# Patient Record
Sex: Female | Born: 1985 | Race: Black or African American | Hispanic: No | Marital: Single | Smoking: Never smoker
Health system: Southern US, Community
[De-identification: ages and names within clinical notes are randomized; demographics above are authoritative.]

## PROBLEM LIST (undated history)

## (undated) DIAGNOSIS — F419 Anxiety disorder, unspecified: Secondary | ICD-10-CM

## (undated) DIAGNOSIS — M5430 Sciatica, unspecified side: Secondary | ICD-10-CM

## (undated) DIAGNOSIS — D649 Anemia, unspecified: Secondary | ICD-10-CM

## (undated) DIAGNOSIS — F32A Depression, unspecified: Secondary | ICD-10-CM

## (undated) DIAGNOSIS — G43909 Migraine, unspecified, not intractable, without status migrainosus: Secondary | ICD-10-CM

## (undated) DIAGNOSIS — F329 Major depressive disorder, single episode, unspecified: Secondary | ICD-10-CM

## (undated) HISTORY — PX: APPENDECTOMY: SHX54

## (undated) HISTORY — PX: TONSILLECTOMY: SUR1361

---

## 2006-09-17 ENCOUNTER — Emergency Department (HOSPITAL_COMMUNITY): Admission: EM | Admit: 2006-09-17 | Discharge: 2006-09-17 | Payer: Self-pay | Admitting: Emergency Medicine

## 2008-03-10 ENCOUNTER — Emergency Department (HOSPITAL_BASED_OUTPATIENT_CLINIC_OR_DEPARTMENT_OTHER): Admission: EM | Admit: 2008-03-10 | Discharge: 2008-03-10 | Payer: Self-pay | Admitting: Emergency Medicine

## 2008-05-28 ENCOUNTER — Emergency Department (HOSPITAL_BASED_OUTPATIENT_CLINIC_OR_DEPARTMENT_OTHER): Admission: EM | Admit: 2008-05-28 | Discharge: 2008-05-28 | Payer: Self-pay | Admitting: Emergency Medicine

## 2008-09-22 ENCOUNTER — Emergency Department (HOSPITAL_BASED_OUTPATIENT_CLINIC_OR_DEPARTMENT_OTHER): Admission: EM | Admit: 2008-09-22 | Discharge: 2008-09-22 | Payer: Self-pay | Admitting: Emergency Medicine

## 2008-09-27 ENCOUNTER — Emergency Department (HOSPITAL_BASED_OUTPATIENT_CLINIC_OR_DEPARTMENT_OTHER): Admission: EM | Admit: 2008-09-27 | Discharge: 2008-09-28 | Payer: Self-pay | Admitting: Emergency Medicine

## 2008-09-28 ENCOUNTER — Ambulatory Visit: Payer: Self-pay | Admitting: Radiology

## 2009-04-21 ENCOUNTER — Ambulatory Visit: Payer: Self-pay | Admitting: Diagnostic Radiology

## 2009-04-21 ENCOUNTER — Emergency Department (HOSPITAL_BASED_OUTPATIENT_CLINIC_OR_DEPARTMENT_OTHER): Admission: EM | Admit: 2009-04-21 | Discharge: 2009-04-21 | Payer: Self-pay | Admitting: Emergency Medicine

## 2010-04-15 LAB — BASIC METABOLIC PANEL
BUN: 15 mg/dL (ref 6–23)
CO2: 26 mEq/L (ref 19–32)
CO2: 28 mEq/L (ref 19–32)
Calcium: 9.3 mg/dL (ref 8.4–10.5)
Creatinine, Ser: 0.6 mg/dL (ref 0.4–1.2)
Creatinine, Ser: 0.6 mg/dL (ref 0.4–1.2)
GFR calc Af Amer: >60 mL/min (ref >60)
Glucose, Bld: 87 mg/dL (ref 70–99)
Potassium: 4.1 mEq/L (ref 3.5–5.1)
Sodium: 141 mEq/L (ref 135–145)

## 2010-04-15 LAB — URINALYSIS, ROUTINE W REFLEX MICROSCOPIC
Bilirubin Urine: NEGATIVE
Glucose, UA: NEGATIVE mg/dL
Leukocytes, UA: NEGATIVE
Protein, ur: NEGATIVE mg/dL
Specific Gravity, Urine: 1.022 (ref 1.005–1.030)
Urobilinogen, UA: >8 mg/dL — ABNORMAL HIGH (ref 0.0–1.0)

## 2010-04-15 LAB — DIFFERENTIAL
Basophils Absolute: 0.2 10*3/uL — ABNORMAL HIGH (ref 0.0–0.1)
Neutrophils Relative %: 50 % (ref 43–77)

## 2010-04-15 LAB — PREGNANCY, URINE
Preg Test, Ur: NEGATIVE
Preg Test, Ur: NEGATIVE

## 2010-04-15 LAB — URINE MICROSCOPIC-ADD ON

## 2010-04-15 LAB — CBC
RDW: 11.9 % (ref 11.5–15.5)
WBC: 8.5 10*3/uL (ref 4.0–10.5)

## 2010-08-14 ENCOUNTER — Emergency Department (HOSPITAL_BASED_OUTPATIENT_CLINIC_OR_DEPARTMENT_OTHER)
Admission: EM | Admit: 2010-08-14 | Discharge: 2010-08-14 | Disposition: A | Discharge status: Home / Self Care | Payer: BC Managed Care – PPO | Attending: Emergency Medicine | Admitting: Emergency Medicine

## 2010-08-14 ENCOUNTER — Encounter: Payer: Self-pay | Admitting: *Deleted

## 2010-08-14 DIAGNOSIS — M25559 Pain in unspecified hip: Secondary | ICD-10-CM | POA: Insufficient documentation

## 2010-08-14 DIAGNOSIS — M76899 Other specified enthesopathies of unspecified lower limb, excluding foot: Secondary | ICD-10-CM | POA: Insufficient documentation

## 2010-08-14 DIAGNOSIS — M7071 Other bursitis of hip, right hip: Secondary | ICD-10-CM

## 2010-08-14 HISTORY — DX: Migraine, unspecified, not intractable, without status migrainosus: G43.909

## 2010-08-14 MED ORDER — HYDROCODONE-ACETAMINOPHEN 5-500 MG PO TABS: 1.0000 | ORAL_TABLET | Freq: Four times a day (QID) | ORAL | Status: AC | PRN

## 2010-08-14 NOTE — ED Notes (Signed)
Pt reports Hx of  R hip pain and decreased mobility x 3 day  Ambulatory with limp

## 2010-08-14 NOTE — ED Provider Notes (Signed)
History     CSN: [id number] Arrival date & time: 08/14/2010  7:36 PM  Chief Complaint  Patient presents with  . 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 female presenting with hip pain. The history is provided by the patient. No language interpreter was used.  Hip Pain This is a recurrent problem. The current episode started in the past 7 days. The problem occurs constantly. The problem has been unchanged. Pertinent negatives include no abdominal pain, fever, joint swelling or rash. The symptoms are aggravated by walking and standing. She has tried relaxation (steriods) for the symptoms. The treatment provided mild relief.    Past Medical History  Diagnosis Date  . Migraines     Past Surgical History  Procedure Date  . Tosillectomy     No family history on file.  History  Substance Use Topics  . Smoking status: Never Smoker   . Smokeless tobacco: Not on file  . Alcohol Use: No    OB History    Grav Para Term Preterm Abortions TAB SAB Ect Mult Living                  Review of Systems  Constitutional: Negative for fever.  Gastrointestinal: Negative for abdominal pain.  Musculoskeletal: Negative for joint swelling.  Skin: Negative for rash.  All other systems reviewed and are negative.    Physical Exam  BP 105/68  Pulse 100  Temp(Src) 98.4 F (36.9 C) (Oral)  Resp 22  SpO2 99%  LMP 07/14/2010  Physical Exam  Nursing note and vitals reviewed. Constitutional: She is oriented to person, place, and time. She appears well-developed and well-nourished.  Cardiovascular: Normal rate and regular rhythm.   Pulmonary/Chest: Effort normal and breath sounds normal.  Abdominal: Soft. Bowel sounds are normal.  Musculoskeletal: Normal range of  motion. She exhibits tenderness.       Pt has generalized tenderness with palpation to the right hip:no obvious deformity noted to the area  Neurological: She is oriented to person, place, and time.  Skin: Skin is warm and dry.  Psychiatric: She has a normal mood and affect.    ED Course  Procedures  MDM Pt has not had injury:likely her bursitis:will treat symptomatically and have follow up with ortho      Teressa Lower, NP 08/14/10 2101

## 2010-08-15 NOTE — ED Provider Notes (Signed)
Medical screening examination/treatment/procedure(s) were performed by non-physician practitioner and as supervising physician I was immediately available for consultation/collaboration.  Carsen Leaf R. Kamisha Ell, MD 08/15/10 0003 

## 2011-06-18 ENCOUNTER — Encounter (HOSPITAL_BASED_OUTPATIENT_CLINIC_OR_DEPARTMENT_OTHER): Payer: Self-pay | Admitting: *Deleted

## 2011-06-18 ENCOUNTER — Emergency Department (HOSPITAL_BASED_OUTPATIENT_CLINIC_OR_DEPARTMENT_OTHER): Payer: BC Managed Care – PPO

## 2011-06-18 ENCOUNTER — Emergency Department (HOSPITAL_BASED_OUTPATIENT_CLINIC_OR_DEPARTMENT_OTHER)
Admission: EM | Admit: 2011-06-18 | Discharge: 2011-06-19 | Disposition: A | Discharge status: Home / Self Care | Payer: BC Managed Care – PPO | Attending: Emergency Medicine | Admitting: Emergency Medicine

## 2011-06-18 DIAGNOSIS — R51 Headache: Secondary | ICD-10-CM | POA: Insufficient documentation

## 2011-06-18 DIAGNOSIS — I10 Essential (primary) hypertension: Secondary | ICD-10-CM | POA: Insufficient documentation

## 2011-06-18 LAB — CBC
Hemoglobin: 8.9 g/dL — ABNORMAL LOW (ref 12.0–15.0)
MCV: 82.8 fL (ref 78.0–100.0)
Platelets: 401 10*3/uL — ABNORMAL HIGH (ref 150–400)
RDW: 13.9 % (ref 11.5–15.5)

## 2011-06-18 LAB — DIFFERENTIAL: Lymphocytes Relative: 19 % (ref 12–46)

## 2011-06-18 MED ORDER — KETOROLAC TROMETHAMINE 60 MG/2ML IM SOLN
60.0000 mg | Freq: Once | INTRAMUSCULAR | Status: AC
  Administered 2011-06-18: 60 mg via INTRAMUSCULAR
  Filled 2011-06-18: qty 2

## 2011-06-18 MED ORDER — PROMETHAZINE HCL 25 MG/ML IJ SOLN
25.0000 mg | Freq: Once | INTRAMUSCULAR | Status: AC
  Administered 2011-06-18: 25 mg via INTRAMUSCULAR
  Filled 2011-06-18: qty 1

## 2011-06-18 NOTE — ED Provider Notes (Signed)
History   This chart was scribed for Geoffery Lyons, MD by Melba Coon. The patient was seen in room MH07/MH07 and the patient's care was started at 10:28PM.    CSN: [id number]  Arrival date & time 06/18/11  2050   First MD Initiated Contact with Patient 06/18/11 2229      Chief Complaint  Patient presents with  . Headache    (Consider location/radiation/quality/duration/timing/severity/associated sxs/prior treatment) HPI Allante Beane is a 26 y.o. female who presents to the Emergency Department complaining of constant, moderate to severe headache more of the left side with associated HTN with an onset 2 days ago. Pt recently just had a successful C-section but pt hasn't felt right since the pregnancy. Nurse came out to pt residence and took pt BP which was 170/100. Pt then went to [facility] yesterday where they did blood w/u and UA; the results came back normal. No prior hx of HTN. No chronic Hx of HAs. Pain meds have not been taken. No fever, neck pain, sore throat, rash, back pain, CP, SOB, abd pain, n/v/d, dysuria, or extremity pain, edema, weakness, numbness, or tingling. No known allergies. No other pertinent medical symptoms.   Past Medical History  Diagnosis Date  . Migraines     Past Surgical History  Procedure Date  . Tosillectomy     History reviewed. No pertinent family history.  History  Substance Use Topics  . Smoking status: Never Smoker   . Smokeless tobacco: Not on file  . Alcohol Use: No    OB History    Grav Para Term Preterm Abortions TAB SAB Ect Mult Living   1 1              Review of Systems 10 Systems reviewed and all are negative for acute change except as noted in the HPI.   Allergies  Review of patient's allergies indicates no known allergies.  Home Medications   Current Outpatient Rx  Name Route Sig Dispense Refill  . ACETAMINOPHEN-CODEINE #3 300-30 MG PO TABS Oral Take 1 tablet by mouth every 4 (four) hours as needed.     Marland Kitchen BUTALBITAL-APAP-CAFF-COD 50-325-40-30 MG PO CAPS Oral Take 1 capsule by mouth every 4 (four) hours as needed.    . CEPHALEXIN 500 MG PO CAPS Oral Take 500 mg by mouth 4 (four) times daily.    Marland Kitchen COLACE PO Oral Take 2 capsules by mouth daily as needed. Patient is using this medication for constipation.    Marland Kitchen FERROUS GLUCONATE 325 MG PO TABS Oral Take 325 mg by mouth daily with breakfast.    . FOLIC ACID 1 MG PO TABS Oral Take 1 mg by mouth daily.    Marland Kitchen HYDROCODONE-ACETAMINOPHEN 7.5-325 MG PO TABS Oral Take 1 tablet by mouth every 6 (six) hours as needed.    . IBUPROFEN 800 MG PO TABS Oral Take 800 mg by mouth every 8 (eight) hours as needed.    . METHYLDOPA 500 MG PO TABS Oral Take 500 mg by mouth 3 (three) times daily.    Marland Kitchen POLYETHYLENE GLYCOL 3350 PO PACK Oral Take 17 g by mouth daily as needed. constipation       BP 159/101  Pulse 96  Temp(Src) 99.8 F (37.7 C) (Oral)  Resp 20  Ht 5\' 5"  (1.651 m)  Wt 222 lb (100.699 kg)  BMI 36.94 kg/m2  SpO2 98%  Physical Exam  Nursing note and vitals reviewed. Constitutional: She is oriented to person, place, and time. She  appears well-developed and well-nourished.       Awake, alert, nontoxic appearance.  HENT:  Head: Normocephalic and atraumatic.  Right Ear: External ear normal.  Left Ear: External ear normal.       TMs nml bilaterally  Eyes: EOM are normal. Pupils are equal, round, and reactive to light. Right eye exhibits no discharge. Left eye exhibits no discharge.  Neck: Normal range of motion.  Cardiovascular: Normal rate, regular rhythm and normal heart sounds.   No murmur heard. Pulmonary/Chest: Effort normal and breath sounds normal. She exhibits no tenderness.  Abdominal: Soft. Bowel sounds are normal. There is no tenderness. There is no rebound.  Musculoskeletal: She exhibits no tenderness.       Baseline ROM, no obvious new focal weakness.  Neurological: She is alert and oriented to person, place, and time.       Mental  status and motor strength appears baseline for patient and situation.  Skin: Skin is warm. No rash noted.  Psychiatric: She has a normal mood and affect. Her behavior is normal.    ED Course  Procedures (including critical care time)  DIAGNOSTIC STUDIES: Oxygen Saturation is 98% on room air, normal by my interpretation.    COORDINATION OF CARE:  10:35PM - EDMD will order CT head, pain meds, and blood w/u for the pt.   Labs Reviewed - No data to display No results found.   No diagnosis found.    MDM  The ct of the head and labs all look okay.  It does not appear as though there is an emergent cause of her headache.  I suspect her blood pressure is elevated because of the headache, rather than the cause.  She will be discharged to home.     I personally performed the services described in this documentation, which was scribed in my presence. The recorded information has been reviewed and considered.          Geoffery Lyons, MD 06/19/11 0005

## 2011-06-18 NOTE — ED Notes (Signed)
Pt states she noticed her BP was up on Friday (171/105, 169/111) 1 week post C-section. Seen at [facility] yesterday. Labs and cath U/A done. Started on BP med, but does not feel any better.

## 2011-06-19 LAB — COMPREHENSIVE METABOLIC PANEL
ALT: 10 U/L (ref 0–35)
AST: 14 U/L (ref 0–37)
Alkaline Phosphatase: 93 U/L (ref 39–117)
BUN: 11 mg/dL (ref 6–23)
CO2: 24 mEq/L (ref 19–32)
Chloride: 105 mEq/L (ref 96–112)
Creatinine, Ser: 0.6 mg/dL (ref 0.50–1.10)
GFR calc non Af Amer: >90 mL/min (ref >90)
Glucose, Bld: 95 mg/dL (ref 70–99)
Sodium: 139 mEq/L (ref 135–145)

## 2011-06-19 NOTE — ED Notes (Signed)
D/c home with family- states will f/u with doctor tomorrow as planned

## 2011-06-19 NOTE — Discharge Instructions (Signed)
General Headache, Without Cause A general headache has no specific cause. These headaches are not life-threatening. They will not lead to other types of headaches. HOME CARE   Make and keep follow-up visits with your doctor.   Only take medicine as told by your doctor.   Try to relax, get a massage, or use your thoughts to control your body (biofeedback).   Apply cold or heat to the head and neck. Apply 3 or 4 times a day or as needed.  Finding out the results of your test Ask when your test results will be ready. Make sure you get your test results. GET HELP RIGHT AWAY IF:   You have problems with medicine.   Your medicine does not help relieve pain.   Your headache changes or becomes worse.   You feel sick to your stomach (nauseous) or throw up (vomit).   You have a temperature by mouth above 102 F (38.9 C), not controlled by medicine.   Your have a stiff neck.   You have vision loss.   You have muscle weakness.   You lose control of your muscles.   You lose balance or have trouble walking.   You feel like you are going to pass out (faint).  MAKE SURE YOU:   Understand these instructions.   Will watch this condition.   Will get help right away if you are not doing well or get worse.  Document Released: 10/05/2007 Document Revised: 12/15/2010 Document Reviewed: 10/05/2007 [employer] Patient Information 2012 [employer]. 

## 2012-03-31 ENCOUNTER — Emergency Department (HOSPITAL_BASED_OUTPATIENT_CLINIC_OR_DEPARTMENT_OTHER)
Admission: EM | Admit: 2012-03-31 | Discharge: 2012-03-31 | Disposition: A | Discharge status: Home / Self Care | Payer: BC Managed Care – PPO | Attending: Emergency Medicine | Admitting: Emergency Medicine

## 2012-03-31 ENCOUNTER — Encounter (HOSPITAL_BASED_OUTPATIENT_CLINIC_OR_DEPARTMENT_OTHER): Payer: Self-pay

## 2012-03-31 ENCOUNTER — Emergency Department (HOSPITAL_BASED_OUTPATIENT_CLINIC_OR_DEPARTMENT_OTHER): Payer: BC Managed Care – PPO

## 2012-03-31 DIAGNOSIS — R072 Precordial pain: Secondary | ICD-10-CM | POA: Insufficient documentation

## 2012-03-31 DIAGNOSIS — R0602 Shortness of breath: Secondary | ICD-10-CM | POA: Insufficient documentation

## 2012-03-31 DIAGNOSIS — Z79899 Other long term (current) drug therapy: Secondary | ICD-10-CM | POA: Insufficient documentation

## 2012-03-31 DIAGNOSIS — M940 Chondrocostal junction syndrome [Tietze]: Secondary | ICD-10-CM | POA: Insufficient documentation

## 2012-03-31 DIAGNOSIS — Z8679 Personal history of other diseases of the circulatory system: Secondary | ICD-10-CM | POA: Insufficient documentation

## 2012-03-31 MED ORDER — IBUPROFEN 800 MG PO TABS: 800.0000 mg | ORAL_TABLET | Freq: Once | ORAL | Status: AC

## 2012-03-31 MED ORDER — IBUPROFEN 800 MG PO TABS
ORAL_TABLET | ORAL | Status: AC
  Administered 2012-03-31: 800 mg via ORAL
  Filled 2012-03-31: qty 1

## 2012-03-31 MED ORDER — NAPROXEN 500 MG PO TABS: 500.0000 mg | ORAL_TABLET | Freq: Two times a day (BID) | ORAL | Status: DC

## 2012-03-31 MED ORDER — HYDROCOD POLST-CHLORPHEN POLST 10-8 MG/5ML PO LQCR: 5.0000 mL | Freq: Two times a day (BID) | ORAL | Status: DC | PRN

## 2012-03-31 MED ORDER — ALBUTEROL SULFATE HFA 108 (90 BASE) MCG/ACT IN AERS: 1.0000 | INHALATION_SPRAY | Freq: Four times a day (QID) | RESPIRATORY_TRACT | Status: DC | PRN

## 2012-03-31 NOTE — ED Provider Notes (Signed)
History     CSN: [id number]  Arrival date & time 03/31/12  1731   First MD Initiated Contact with Patient 03/31/12 1755      Chief Complaint  Patient presents with  . Cough  . Pleurisy    (Consider location/radiation/quality/duration/timing/severity/associated sxs/prior treatment) HPI Comments: Pt presents to the ED for non-productive cough x 2 weeks.  Now she is having some non-radiating mid-sternal chest pain that is exacerbated by twisting movements or deep breathing.  Intermittent SOB with exertion. Recent episode of laryngitis which has finally resolved.  Has not tried any OTC medications for her sx. Denies any palpitations, fever, nausea, vomiting, or abdominal pain.  No cardiac hx.  Patient is a 27 y.o. female presenting with cough. The history is provided by the patient.  Cough   Past Medical History  Diagnosis Date  . Migraines     Past Surgical History  Procedure Laterality Date  . Tosillectomy    . Cesarean section  06/09/2011    History reviewed. No pertinent family history.  History  Substance Use Topics  . Smoking status: Never Smoker   . Smokeless tobacco: Never Used  . Alcohol Use: No    OB History   Grav Para Term Preterm Abortions TAB SAB Ect Mult Living   1 1              Review of Systems  Respiratory: Positive for cough.   All other systems reviewed and are negative.    Allergies  Review of patient's allergies indicates no known allergies.  Home Medications   Current Outpatient Rx  Name  Route  Sig  Dispense  Refill  . etonogestrel (NEXPLANON) 68 MG IMPL implant   Subcutaneous   Inject 1 each into the skin once.         . phentermine 37.5 MG capsule   Oral   Take 37.5 mg by mouth every morning.         . topiramate (TOPAMAX) 100 MG tablet   Oral   Take 100 mg by mouth daily.         Marland Kitchen acetaminophen-codeine (TYLENOL #3) 300-30 MG per tablet   Oral   Take 1 tablet by mouth every 4 (four) hours as needed.         .  butalbital-acetaminophen-caffeine (FIORICET WITH CODEINE) 50-325-40-30 MG per capsule   Oral   Take 1 capsule by mouth every 4 (four) hours as needed.         . cephALEXin (KEFLEX) 500 MG capsule   Oral   Take 500 mg by mouth 4 (four) times daily.         Tery Sanfilippo Sodium (COLACE PO)   Oral   Take 2 capsules by mouth daily as needed. Patient is using this medication for constipation.         . ferrous gluconate (FERGON) 325 MG tablet   Oral   Take 325 mg by mouth daily with breakfast.         . folic acid (FOLVITE) 1 MG tablet   Oral   Take 1 mg by mouth daily.         Marland Kitchen HYDROcodone-acetaminophen (NORCO) 7.5-325 MG per tablet   Oral   Take 1 tablet by mouth every 6 (six) hours as needed.         Marland Kitchen ibuprofen (ADVIL,MOTRIN) 800 MG tablet   Oral   Take 800 mg by mouth every 8 (eight) hours as needed.         Marland Kitchen  methyldopa (ALDOMET) 500 MG tablet   Oral   Take 500 mg by mouth 3 (three) times daily.         . polyethylene glycol (MIRALAX / GLYCOLAX) packet   Oral   Take 17 g by mouth daily as needed. constipation            BP 127/90  Pulse 93  Temp(Src) 98.2 F (36.8 C) (Oral)  Resp 16  Ht 5\' 4"  (1.626 m)  Wt 198 lb (89.812 kg)  BMI 33.97 kg/m2  SpO2 100%  LMP 03/27/2012  Physical Exam  Nursing note and vitals reviewed. Constitutional: She is oriented to person, place, and time. She appears well-developed and well-nourished.  HENT:  Head: Normocephalic and atraumatic.  Right Ear: Tympanic membrane and ear canal normal.  Left Ear: Tympanic membrane and ear canal normal.  Nose: Nose normal.  Mouth/Throat: Uvula is midline, oropharynx is clear and moist and mucous membranes are normal. No oropharyngeal exudate, posterior oropharyngeal edema, posterior oropharyngeal erythema or tonsillar abscesses.  Eyes: Conjunctivae and EOM are normal. Pupils are equal, round, and reactive to light.  Neck: Normal range of motion.  Cardiovascular: Normal rate,  regular rhythm and normal heart sounds.   Pulmonary/Chest: Effort normal and breath sounds normal. She has no wheezes.  Chest pain is reproducible with palpation to L anterior chest wall  Abdominal: Soft. Bowel sounds are normal. There is no tenderness.  Lymphadenopathy:    She has no cervical adenopathy.  Neurological: She is alert and oriented to person, place, and time.  Skin: Skin is warm and dry.  Psychiatric: She has a normal mood and affect.    ED Course  Procedures (including critical care time)  Labs Reviewed - No data to display Dg Chest 2 View  03/31/2012  *RADIOLOGY REPORT*  Clinical Data: Chest pain.  CHEST - 2 VIEW  Comparison: 04/21/2009.  Findings: The cardiac silhouette, mediastinal and hilar contours are within normal limits and stable.  The lungs are clear.  No pleural effusion.  The bony thorax is intact.  IMPRESSION: No acute cardiopulmonary findings.   Original Report Authenticated By: Rudie Meyer, M.D.      1. Costochondritis, acute       MDM   CXR negative for bronchitis or pneumonia.  O2 sats 100% on room air.  Given nature of pain and reproducibility with palpation, suspect that pain is MSK in nature, likely costochondritis, from heavy bouts of coughing.  Rx anti-inflammatories, albuterol, and cough supressant.  Return precautions advised.       Garlon Hatchet, PA-C 03/31/12 2139

## 2012-03-31 NOTE — ED Notes (Signed)
Pt states that she has been coughing x2 weeks now, states that she has sternal chest pain when moving or coughing.  Cough is non productive and pt denies all other symptoms.

## 2012-04-01 NOTE — ED Provider Notes (Signed)
Medical screening examination/treatment/procedure(s) were performed by non-physician practitioner and as supervising physician I was immediately available for consultation/collaboration.   Nelia Shi, MD 04/01/12 [phone]

## 2012-06-24 ENCOUNTER — Other Ambulatory Visit: Payer: Self-pay | Admitting: Physician Assistant

## 2012-06-24 DIAGNOSIS — M25551 Pain in right hip: Secondary | ICD-10-CM

## 2012-06-30 ENCOUNTER — Ambulatory Visit
Admission: RE | Admit: 2012-06-30 | Discharge: 2012-06-30 | Disposition: A | Discharge status: Home / Self Care | Payer: BC Managed Care – PPO | Source: Ambulatory Visit | Attending: Physician Assistant | Admitting: Physician Assistant

## 2012-06-30 DIAGNOSIS — M25551 Pain in right hip: Secondary | ICD-10-CM

## 2012-06-30 MED ORDER — GADOBENATE DIMEGLUMINE 529 MG/ML IV SOLN
20.0000 mL | Freq: Once | INTRAVENOUS | Status: AC | PRN
  Administered 2012-06-30: 20 mL via INTRAVENOUS

## 2012-11-09 ENCOUNTER — Encounter (HOSPITAL_BASED_OUTPATIENT_CLINIC_OR_DEPARTMENT_OTHER): Payer: Self-pay | Admitting: Emergency Medicine

## 2012-11-09 ENCOUNTER — Emergency Department (HOSPITAL_BASED_OUTPATIENT_CLINIC_OR_DEPARTMENT_OTHER)
Admission: EM | Admit: 2012-11-09 | Discharge: 2012-11-09 | Disposition: A | Discharge status: Home / Self Care | Payer: BC Managed Care – PPO | Attending: Emergency Medicine | Admitting: Emergency Medicine

## 2012-11-09 DIAGNOSIS — IMO0001 Reserved for inherently not codable concepts without codable children: Secondary | ICD-10-CM | POA: Insufficient documentation

## 2012-11-09 DIAGNOSIS — M25559 Pain in unspecified hip: Secondary | ICD-10-CM | POA: Insufficient documentation

## 2012-11-09 DIAGNOSIS — Z79899 Other long term (current) drug therapy: Secondary | ICD-10-CM | POA: Insufficient documentation

## 2012-11-09 DIAGNOSIS — M25551 Pain in right hip: Secondary | ICD-10-CM

## 2012-11-09 DIAGNOSIS — G43909 Migraine, unspecified, not intractable, without status migrainosus: Secondary | ICD-10-CM | POA: Insufficient documentation

## 2012-11-09 DIAGNOSIS — M25459 Effusion, unspecified hip: Secondary | ICD-10-CM | POA: Insufficient documentation

## 2012-11-09 DIAGNOSIS — Z791 Long term (current) use of non-steroidal anti-inflammatories (NSAID): Secondary | ICD-10-CM | POA: Insufficient documentation

## 2012-11-09 DIAGNOSIS — D649 Anemia, unspecified: Secondary | ICD-10-CM | POA: Insufficient documentation

## 2012-11-09 HISTORY — DX: Sciatica, unspecified side: M54.30

## 2012-11-09 HISTORY — DX: Anemia, unspecified: D64.9

## 2012-11-09 MED ORDER — HYDROCODONE-ACETAMINOPHEN 5-325 MG PO TABS: 2.0000 | ORAL_TABLET | ORAL | Status: DC | PRN

## 2012-11-09 MED ORDER — HYDROCODONE-ACETAMINOPHEN 5-325 MG PO TABS
2.0000 | ORAL_TABLET | Freq: Once | ORAL | Status: AC
  Administered 2012-11-09: 2 via ORAL
  Filled 2012-11-09: qty 2

## 2012-11-09 MED ORDER — PREDNISONE 10 MG PO TABS: ORAL_TABLET | ORAL | Status: DC

## 2012-11-09 NOTE — ED Provider Notes (Signed)
CSN: [id number]     Arrival date & time 11/09/12  2205 History   First MD Initiated Contact with Patient 11/09/12 2216     Chief Complaint  Patient presents with  . Hip Pain   (Consider location/radiation/quality/duration/timing/severity/associated sxs/prior Treatment) Patient is a 27 y.o. female presenting with hip pain.  Hip Pain This is a new problem. Episode onset: 2 years. The problem occurs constantly. The problem has been gradually worsening. Associated symptoms include joint swelling and myalgias. Nothing aggravates the symptoms. She has tried nothing for the symptoms. The treatment provided moderate relief.   Pt complains of pain in both hips,  Pt has had a negative mri.   Pt reports orthopaedist toldhe she has bursitis. Past Medical History  Diagnosis Date  . Migraines   . Sciatica   . Anemia    Past Surgical History  Procedure Laterality Date  . Tosillectomy    . Cesarean section  06/09/2011   History reviewed. No pertinent family history. History  Substance Use Topics  . Smoking status: Never Smoker   . Smokeless tobacco: Never Used  . Alcohol Use: No   OB History   Grav Para Term Preterm Abortions TAB SAB Ect Mult Living   1 1             Review of Systems  Musculoskeletal: Positive for joint swelling and myalgias.  All other systems reviewed and are negative.    Allergies  Review of patient's allergies indicates no known allergies.  Home Medications   Current Outpatient Rx  Name  Route  Sig  Dispense  Refill  . Docusate Sodium (COLACE PO)   Oral   Take 2 capsules by mouth daily as needed. Patient is using this medication for constipation.         Marland Kitchen etonogestrel (NEXPLANON) 68 MG IMPL implant   Subcutaneous   Inject 1 each into the skin once.         Marland Kitchen ibuprofen (ADVIL,MOTRIN) 800 MG tablet   Oral   Take 800 mg by mouth every 8 (eight) hours as needed.         . polyethylene glycol (MIRALAX / GLYCOLAX) packet   Oral   Take 17 g by mouth  daily as needed. constipation          . potassium chloride (KLOR-CON) 20 MEQ packet   Oral   Take 20 mEq by mouth daily.         . pregabalin (LYRICA) 75 MG capsule   Oral   Take 75 mg by mouth 2 (two) times daily.         Marland Kitchen topiramate (TOPAMAX) 100 MG tablet   Oral   Take 100 mg by mouth daily.         Marland Kitchen acetaminophen-codeine (TYLENOL #3) 300-30 MG per tablet   Oral   Take 1 tablet by mouth every 4 (four) hours as needed.         Marland Kitchen albuterol (PROVENTIL HFA;VENTOLIN HFA) 108 (90 BASE) MCG/ACT inhaler   Inhalation   Inhale 1-2 puffs into the lungs every 6 (six) hours as needed for wheezing.   1 Inhaler   0   . butalbital-acetaminophen-caffeine (FIORICET WITH CODEINE) 50-325-40-30 MG per capsule   Oral   Take 1 capsule by mouth every 4 (four) hours as needed.         . cephALEXin (KEFLEX) 500 MG capsule   Oral   Take 500 mg by mouth 4 (four) times daily.         Marland Kitchen  chlorpheniramine-HYDROcodone (TUSSIONEX PENNKINETIC ER) 10-8 MG/5ML LQCR   Oral   Take 5 mLs by mouth every 12 (twelve) hours as needed (Cough).   115 mL   0   . ferrous gluconate (FERGON) 325 MG tablet   Oral   Take 325 mg by mouth daily with breakfast.         . folic acid (FOLVITE) 1 MG tablet   Oral   Take 1 mg by mouth daily.         Marland Kitchen HYDROcodone-acetaminophen (NORCO) 7.5-325 MG per tablet   Oral   Take 1 tablet by mouth every 6 (six) hours as needed.         . methyldopa (ALDOMET) 500 MG tablet   Oral   Take 500 mg by mouth 3 (three) times daily.         . naproxen (NAPROSYN) 500 MG tablet   Oral   Take 1 tablet (500 mg total) by mouth 2 (two) times daily with a meal.   30 tablet   0   . phentermine 37.5 MG capsule   Oral   Take 37.5 mg by mouth every morning.          There were no vitals taken for this visit. Physical Exam  Nursing note and vitals reviewed. Constitutional: She appears well-developed and well-nourished.  Cardiovascular: Normal rate.    Pulmonary/Chest: Effort normal.  Abdominal: Soft.  Musculoskeletal: She exhibits tenderness.  Tender bilat hips  Neurological: She is alert.  Skin: Skin is warm.  Psychiatric: She has a normal mood and affect.    ED Course  Procedures (including critical care time) Labs Review Labs Reviewed - No data to display Imaging Review No results found.  EKG Interpretation   None       MDM   1. Hip pain, bilateral        Elson Areas, PA-C 11/09/12 2255

## 2012-11-09 NOTE — ED Notes (Signed)
Pt reports bilateral hip pain x2 years that has worsened today, denies injury.

## 2012-11-09 NOTE — ED Notes (Signed)
Patient has chronic bilateral hip pain. Periodically the pain becomes worse and she is unable to treat the pain at home.

## 2012-11-10 NOTE — ED Provider Notes (Signed)
Medical screening examination/treatment/procedure(s) were performed by non-physician practitioner and as supervising physician I was immediately available for consultation/collaboration.  EKG Interpretation   None         Anani Gu S Aishah Teffeteller, MD 11/10/12 1033 

## 2012-11-15 ENCOUNTER — Ambulatory Visit (INDEPENDENT_AMBULATORY_CARE_PROVIDER_SITE_OTHER): Payer: Self-pay

## 2012-11-15 ENCOUNTER — Ambulatory Visit (INDEPENDENT_AMBULATORY_CARE_PROVIDER_SITE_OTHER): Payer: BC Managed Care – PPO | Admitting: Neurology

## 2012-11-15 DIAGNOSIS — M79609 Pain in unspecified limb: Secondary | ICD-10-CM

## 2012-11-15 DIAGNOSIS — Z0289 Encounter for other administrative examinations: Secondary | ICD-10-CM

## 2012-11-15 DIAGNOSIS — M79604 Pain in right leg: Secondary | ICD-10-CM

## 2012-11-15 NOTE — Procedures (Signed)
    [facility]  NCS (NERVE CONDUCTION STUDY) WITH EMG (ELECTROMYOGRAPHY) REPORT   STUDY DATE: 11/15/2012 PATIENT NAME: Ngina Royer DOB: May 12, 1985 MRN: [id number]    TECHNOLOGIST: Gearldine Shown ELECTROMYOGRAPHER: Levert Feinstein M.D.  CLINICAL INFORMATION:   27 years old female, with right hip pain,  FINDINGS: NERVE CONDUCTION STUDY: Bilateral peroneal sensory responses are normal. Bilateral peroneal to  EDB, and tibial motor responses were normal. Bilateral tibial H. reflexes were normal and symmetric    NEEDLE ELECTROMYOGRAPHY: Selected needle examination was performed at right lower extremity muscles and the right lumbosacral paraspinal muscles.  Needle examination of right tibialis anterior, tibialis posterior, medial gastrocnemius, vastus lateralis, biceps femoris long head was normal.  There was no spontaneous activity at right lumbosacral paraspinal muscles, right L4-L5 , S1,     IMPRESSION:   This is a normal study. There is no electrodiagnostic evidence of large fiber peripheral neuropathy or right lumbosacral radiculopathy    INTERPRETING PHYSICIAN:   Levert Feinstein M.D. Ph.D. [facility] [address], [address] [place], [place] [address] [phone]

## 2013-04-27 ENCOUNTER — Observation Stay (HOSPITAL_BASED_OUTPATIENT_CLINIC_OR_DEPARTMENT_OTHER)
Admission: EM | Admit: 2013-04-27 | Discharge: 2013-04-28 | Disposition: A | Discharge status: Home / Self Care | Payer: BC Managed Care – PPO | Attending: Surgery | Admitting: Surgery

## 2013-04-27 ENCOUNTER — Emergency Department (HOSPITAL_BASED_OUTPATIENT_CLINIC_OR_DEPARTMENT_OTHER): Payer: BC Managed Care – PPO

## 2013-04-27 ENCOUNTER — Encounter (HOSPITAL_BASED_OUTPATIENT_CLINIC_OR_DEPARTMENT_OTHER): Payer: Self-pay | Admitting: Emergency Medicine

## 2013-04-27 DIAGNOSIS — R1031 Right lower quadrant pain: Secondary | ICD-10-CM

## 2013-04-27 DIAGNOSIS — K3580 Unspecified acute appendicitis: Principal | ICD-10-CM | POA: Insufficient documentation

## 2013-04-27 DIAGNOSIS — K37 Unspecified appendicitis: Secondary | ICD-10-CM | POA: Diagnosis present

## 2013-04-27 DIAGNOSIS — N200 Calculus of kidney: Secondary | ICD-10-CM | POA: Insufficient documentation

## 2013-04-27 DIAGNOSIS — Z79899 Other long term (current) drug therapy: Secondary | ICD-10-CM | POA: Insufficient documentation

## 2013-04-27 DIAGNOSIS — D649 Anemia, unspecified: Secondary | ICD-10-CM | POA: Insufficient documentation

## 2013-04-27 DIAGNOSIS — G43909 Migraine, unspecified, not intractable, without status migrainosus: Secondary | ICD-10-CM | POA: Insufficient documentation

## 2013-04-27 LAB — COMPREHENSIVE METABOLIC PANEL
ALBUMIN: 4.2 g/dL (ref 3.5–5.2)
ALT: 25 U/L (ref 0–35)
AST: 30 U/L (ref 0–37)
Alkaline Phosphatase: 83 U/L (ref 39–117)
BUN: 13 mg/dL (ref 6–23)
CALCIUM: 9.4 mg/dL (ref 8.4–10.5)
CO2: 22 meq/L (ref 19–32)
Chloride: 107 mEq/L (ref 96–112)
Creatinine, Ser: 0.8 mg/dL (ref 0.50–1.10)
GFR calc Af Amer: >90 mL/min (ref >90)
Glucose, Bld: 90 mg/dL (ref 70–99)
Potassium: 4.2 mEq/L (ref 3.7–5.3)
SODIUM: 141 meq/L (ref 137–147)
Total Bilirubin: 1.2 mg/dL (ref 0.3–1.2)
Total Protein: 7.4 g/dL (ref 6.0–8.3)

## 2013-04-27 LAB — CBC WITH DIFFERENTIAL/PLATELET
BASOS ABS: 0 10*3/uL (ref 0.0–0.1)
BASOS PCT: 0 % (ref 0–1)
EOS PCT: 1 % (ref 0–5)
Eosinophils Absolute: 0 10*3/uL (ref 0.0–0.7)
HCT: 36.8 % (ref 36.0–46.0)
Hemoglobin: 12.4 g/dL (ref 12.0–15.0)
LYMPHS PCT: 27 % (ref 12–46)
Lymphs Abs: 2 10*3/uL (ref 0.7–4.0)
MCH: 29.3 pg (ref 26.0–34.0)
MCHC: 33.7 g/dL (ref 30.0–36.0)
MCV: 87 fL (ref 78.0–100.0)
Monocytes Absolute: 0.5 10*3/uL (ref 0.1–1.0)
Monocytes Relative: 6 % (ref 3–12)
NEUTROS ABS: 5 10*3/uL (ref 1.7–7.7)
Neutrophils Relative %: 66 % (ref 43–77)
PLATELETS: 289 10*3/uL (ref 150–400)
RBC: 4.23 MIL/uL (ref 3.87–5.11)
RDW: 12 % (ref 11.5–15.5)
WBC: 7.6 10*3/uL (ref 4.0–10.5)

## 2013-04-27 LAB — URINALYSIS, ROUTINE W REFLEX MICROSCOPIC
Glucose, UA: NEGATIVE mg/dL
KETONES UR: NEGATIVE mg/dL
NITRITE: NEGATIVE
PROTEIN: NEGATIVE mg/dL
Specific Gravity, Urine: 1.021 (ref 1.005–1.030)
pH: 6.5 (ref 5.0–8.0)

## 2013-04-27 LAB — CREATININE, SERUM
Creatinine, Ser: 0.71 mg/dL (ref 0.50–1.10)
GFR calc Af Amer: >90 mL/min (ref >90)

## 2013-04-27 LAB — CBC
HEMATOCRIT: 32.5 % — AB (ref 36.0–46.0)
HEMOGLOBIN: 10.9 g/dL — AB (ref 12.0–15.0)
MCH: 28.5 pg (ref 26.0–34.0)
MCHC: 33.5 g/dL (ref 30.0–36.0)
MCV: 85.1 fL (ref 78.0–100.0)
Platelets: 293 10*3/uL (ref 150–400)
RBC: 3.82 MIL/uL — AB (ref 3.87–5.11)
RDW: 12.4 % (ref 11.5–15.5)
WBC: 7.3 10*3/uL (ref 4.0–10.5)

## 2013-04-27 LAB — URINE MICROSCOPIC-ADD ON

## 2013-04-27 LAB — LIPASE, BLOOD: Lipase: 17 U/L (ref 11–59)

## 2013-04-27 LAB — PREGNANCY, URINE: PREG TEST UR: NEGATIVE

## 2013-04-27 MED ORDER — PIPERACILLIN-TAZOBACTAM 3.375 G IVPB
3.3750 g | Freq: Three times a day (TID) | INTRAVENOUS | Status: DC
  Administered 2013-04-28 (×2): 3.375 g via INTRAVENOUS
  Filled 2013-04-27 (×4): qty 50

## 2013-04-27 MED ORDER — SODIUM CHLORIDE 0.9 % IV BOLUS (SEPSIS)
1000.0000 mL | Freq: Once | INTRAVENOUS | Status: AC
  Administered 2013-04-27: 1000 mL via INTRAVENOUS

## 2013-04-27 MED ORDER — HYDROMORPHONE HCL PF 1 MG/ML IJ SOLN
1.0000 mg | INTRAMUSCULAR | Status: DC | PRN
  Administered 2013-04-27: 1 mg via INTRAVENOUS
  Filled 2013-04-27: qty 1

## 2013-04-27 MED ORDER — HYDROMORPHONE HCL PF 1 MG/ML IJ SOLN
1.0000 mg | Freq: Once | INTRAMUSCULAR | Status: AC
  Administered 2013-04-27: 1 mg via INTRAVENOUS
  Filled 2013-04-27: qty 1

## 2013-04-27 MED ORDER — PANTOPRAZOLE SODIUM 40 MG IV SOLR
40.0000 mg | Freq: Every day | INTRAVENOUS | Status: DC
  Administered 2013-04-27: 40 mg via INTRAVENOUS
  Filled 2013-04-27 (×2): qty 40

## 2013-04-27 MED ORDER — IOHEXOL 300 MG/ML  SOLN
50.0000 mL | Freq: Once | INTRAMUSCULAR | Status: AC | PRN
  Administered 2013-04-27: 50 mL via ORAL

## 2013-04-27 MED ORDER — PIPERACILLIN-TAZOBACTAM 3.375 G IVPB
3.3750 g | Freq: Once | INTRAVENOUS | Status: AC
  Administered 2013-04-27: 3.375 g via INTRAVENOUS
  Filled 2013-04-27: qty 50

## 2013-04-27 MED ORDER — ONDANSETRON HCL 4 MG/2ML IJ SOLN
4.0000 mg | Freq: Once | INTRAMUSCULAR | Status: AC
  Administered 2013-04-27: 4 mg via INTRAVENOUS
  Filled 2013-04-27: qty 2

## 2013-04-27 MED ORDER — DEXTROSE IN LACTATED RINGERS 5 % IV SOLN
INTRAVENOUS | Status: DC
  Administered 2013-04-27: 23:00:00 via INTRAVENOUS

## 2013-04-27 MED ORDER — ONDANSETRON HCL 4 MG/2ML IJ SOLN
4.0000 mg | Freq: Four times a day (QID) | INTRAMUSCULAR | Status: DC | PRN
Start: 2013-04-27 — End: 2013-04-28
  Administered 2013-04-27: 4 mg via INTRAVENOUS
  Filled 2013-04-27: qty 2

## 2013-04-27 MED ORDER — ENOXAPARIN SODIUM 40 MG/0.4ML ~~LOC~~ SOLN
40.0000 mg | SUBCUTANEOUS | Status: DC
  Filled 2013-04-27: qty 0.4

## 2013-04-27 MED ORDER — IOHEXOL 300 MG/ML  SOLN
100.0000 mL | Freq: Once | INTRAMUSCULAR | Status: AC | PRN
  Administered 2013-04-27: 100 mL via INTRAVENOUS

## 2013-04-27 MED ORDER — HYDROMORPHONE HCL PF 1 MG/ML IJ SOLN
1.0000 mg | Freq: Once | INTRAMUSCULAR | Status: AC
Start: 2013-04-27 — End: 2013-04-27
  Administered 2013-04-27: 1 mg via INTRAVENOUS
  Filled 2013-04-27: qty 1

## 2013-04-27 NOTE — ED Provider Notes (Signed)
CSN: [id number]     Arrival date & time 04/27/13  1420 History  This chart was scribed for Hilario Quarryanielle S Cyleigh Massaro, MD by Dorothey Basemania Sutton, ED Scribe. This patient was seen in room MH06/MH06 and the patient's care was started at 3:42 PM.    Chief Complaint  Patient presents with  . Abdominal Pain   Patient is a 28 y.o. female presenting with abdominal pain. The history is provided by the patient. No language interpreter was used.  Abdominal Pain Pain location:  RLQ Pain quality: pressure and sharp   Pain severity:  Moderate Onset quality:  Sudden Timing:  Constant Progression:  Worsening Chronicity:  New Relieved by:  Nothing Worsened by:  Nothing tried Ineffective treatments:  NSAIDs (ibuprofen) Associated symptoms: nausea   Associated symptoms: no vomiting   Nausea:    Severity:  Moderate   Onset quality:  Gradual   Timing:  Constant   Progression:  Unchanged Risk factors: no alcohol abuse, not elderly, has not had multiple surgeries and not pregnant    HPI Comments: Katherine Millersatasha Owensby is a 28 y.o. female who presents to the Emergency Department complaining of a sharp, pressure-like pain to the RLQ of the abdomen with sudden onset yesterday that she states has been progressively worsening. She reports some associated, mild nausea. She states that these symptoms are new for her. Patient reports taking ibuprofen at home without significant relief. She denies any exacerbating or alleviating factors. She denies vomiting, appetite change, urinary symptoms. Patient reports that she uses Nexplanon for birth control and her LMP was about 5 days ago and was normal. Patient has a history of cesarean section. Patient also has a history of migraines, sciatica, and anemia. Patient does not smoke or drink.   Past Medical History  Diagnosis Date  . Migraines   . Sciatica   . Anemia    Past Surgical History  Procedure Laterality Date  . Cesarean section  06/09/2011  . Tonsillectomy     No family history on  file. History  Substance Use Topics  . Smoking status: Never Smoker   . Smokeless tobacco: Never Used  . Alcohol Use: No   OB History   Grav Para Term Preterm Abortions TAB SAB Ect Mult Living   1 1             Review of Systems  Constitutional: Negative for appetite change.  Gastrointestinal: Positive for nausea and abdominal pain. Negative for vomiting.  Genitourinary: Negative for difficulty urinating.  All other systems reviewed and are negative.     Allergies  Review of patient's allergies indicates no known allergies.  Home Medications   Prior to Admission medications   Medication Sig Start Date End Date Taking? Authorizing Provider  etonogestrel (NEXPLANON) 68 MG IMPL implant Inject 1 each into the skin once.   Yes Historical Provider, MD  ibuprofen (ADVIL,MOTRIN) 800 MG tablet Take 800 mg by mouth every 8 (eight) hours as needed.   Yes Historical Provider, MD  phentermine 37.5 MG capsule Take 37.5 mg by mouth every morning.   Yes Historical Provider, MD  polyethylene glycol (MIRALAX / GLYCOLAX) packet Take 17 g by mouth daily as needed. constipation    Yes Historical Provider, MD  topiramate (TOPAMAX) 100 MG tablet Take 100 mg by mouth daily.   Yes Historical Provider, MD  traMADol (ULTRAM) 50 MG tablet Take 50 mg by mouth every 6 (six) hours as needed.   Yes Historical Provider, MD   Triage Vitals: BP 126/94  Pulse 100  Temp(Src) 98.3 F (36.8 C) (Oral)  Resp 16  Ht 5\' 4"  (1.626 m)  Wt 206 lb (93.441 kg)  BMI 35.34 kg/m2  SpO2 100%  Physical Exam  Nursing note and vitals reviewed. Constitutional: She is oriented to person, place, and time. She appears well-developed and well-nourished. No distress.  HENT:  Head: Normocephalic and atraumatic.  Right Ear: Hearing, tympanic membrane, external ear and ear canal normal.  Left Ear: Hearing, tympanic membrane, external ear and ear canal normal.  Mouth/Throat: Oropharynx is clear and moist. No oropharyngeal  exudate.  Eyes: Conjunctivae and EOM are normal. Pupils are equal, round, and reactive to light.  Neck: Normal range of motion. Neck supple.  Cardiovascular: Normal rate, regular rhythm and normal heart sounds.   Pulmonary/Chest: Effort normal and breath sounds normal. No respiratory distress.  Abdominal: She exhibits no distension. There is tenderness. There is rebound.  Tenderness to palpation to the RLQ with rebound. Some RUQ tenderness. No CVA tenderness.   Musculoskeletal: Normal range of motion. She exhibits no edema.  Neurological: She is alert and oriented to person, place, and time.  Skin: Skin is warm and dry.  Psychiatric: She has a normal mood and affect. Her behavior is normal.    ED Course  Procedures (including critical care time)  DIAGNOSTIC STUDIES: Oxygen Saturation is 100% on room air, normal by my interpretation.    COORDINATION OF CARE: 3:45 PM- Ordered UA, CBC, CMP, lipase, and a CT of the abdomen. Ordered IV fluids, Zofran, and Dilaudid to manage symptoms. Discussed treatment plan with patient at bedside and patient verbalized agreement.   5:39 PM- Discussed that CT results indicate appendicitis, which will require surgery. [facility]. Advised patient to stay NPO, last was around 10 hours ago. Discussed treatment plan with patient at bedside and patient verbalized agreement.    Labs Review Labs Reviewed  URINALYSIS, ROUTINE W REFLEX MICROSCOPIC - Abnormal; Notable for the following:    Color, Urine AMBER (*)    Hgb urine dipstick TRACE (*)    Bilirubin Urine SMALL (*)    Urobilinogen, UA >8.0 (*)    Leukocytes, UA SMALL (*)    All other components within normal limits  PREGNANCY, URINE  URINE MICROSCOPIC-ADD ON  CBC WITH DIFFERENTIAL  COMPREHENSIVE METABOLIC PANEL  LIPASE, BLOOD    Imaging Review Ct Abdomen Pelvis W Contrast  04/27/2013   CLINICAL DATA:  Right lower quadrant abdominal pain for 1 day. Nausea.  EXAM: CT  ABDOMEN AND PELVIS WITH CONTRAST  TECHNIQUE: Multidetector CT imaging of the abdomen and pelvis was performed using the standard protocol following bolus administration of intravenous contrast.  CONTRAST:  50mL OMNIPAQUE IOHEXOL 300 MG/ML SOLN, 100mL OMNIPAQUE IOHEXOL 300 MG/ML SOLN  COMPARISON:  None.  FINDINGS: Liver, spleen, and adrenal glands unremarkable. Several small calcifications are present along the pancreatic parenchyma, and could reflect prior pancreatitis.  2 mm right kidney lower pole nonobstructive calculus. 6 mm cyst of the right mid kidney anteriorly. Kidneys otherwise unremarkable.  No pathologic upper abdominal adenopathy is observed. No pathologic pelvic adenopathy is observed. Appendiceal diameter 1.1 cm with periappendiceal stranding compatible with acute appendicitis.  Mild prominence of gas and stool in the colon.  2.9 cm rim calcified right fundal fibroid is subserosal in location.  Bone island in the left medial femoral head.  IMPRESSION: 1. Acute nonruptured appendicitis. 2. Nonobstructive right nephrolithiasis. 3. Several small calcifications along the pancreatic parenchyma, suspicious for chronic or remote  pancreatitis. 4.  Prominent stool throughout the colon favors constipation. 5. Rim calcified subserosal right uterine fundal fibroid.   Electronically Signed   By: Herbie Baltimore M.D.   On: 04/27/2013 17:31     EKG Interpretation None      MDM   Final diagnoses:  None    Patient care discussed with Dr.Cornett and Dr. Deretha Emory.  Patient given zosyn here.  Discussed plan with patient and she voices agreement and understanding.  Patient being transferred to [facility] ED.   I personally performed the services described in this documentation, which was scribed in my presence. The recorded information has been reviewed and considered.    Hilario Quarry, MD 04/27/13 [phone]

## 2013-04-27 NOTE — H&P (Signed)
Katherine Patel is an 28 y.o. female.   Chief Complaint: abdominal pain/ appendicitis HPI: Transferred from [facility] ED with 24 hours of diffuse now RLQ abdominal pain. Started last night.  Sharp location RLQ no radiation. No nausea or vomiting currently.  Pain intensity now is 0 but ranging from 0 -8.  No fever or chills.   Past Medical History  Diagnosis Date  . Migraines   . Sciatica   . Anemia     Past Surgical History  Procedure Laterality Date  . Cesarean section  06/09/2011  . Tonsillectomy      No family history on file. Social History:  reports that she has never smoked. She has never used smokeless tobacco. She reports that she does not drink alcohol or use illicit drugs.  Allergies: No Known Allergies   (Not in a hospital admission)  Results for orders placed during the hospital encounter of 04/27/13 (from the past 48 hour(s))  PREGNANCY, URINE     Status: None   Collection Time    04/27/13  2:39 PM      Result Value Ref Range   Preg Test, Ur NEGATIVE  NEGATIVE   Comment:            THE SENSITIVITY OF THIS     METHODOLOGY IS >20 mIU/mL.  URINALYSIS, ROUTINE W REFLEX MICROSCOPIC     Status: Abnormal   Collection Time    04/27/13  2:39 PM      Result Value Ref Range   Color, Urine AMBER (*) YELLOW   Comment: BIOCHEMICALS MAY BE AFFECTED BY COLOR   APPearance CLEAR  CLEAR   Specific Gravity, Urine 1.021  1.005 - 1.030   pH 6.5  5.0 - 8.0   Glucose, UA NEGATIVE  NEGATIVE mg/dL   Hgb urine dipstick TRACE (*) NEGATIVE   Bilirubin Urine SMALL (*) NEGATIVE   Ketones, ur NEGATIVE  NEGATIVE mg/dL   Protein, ur NEGATIVE  NEGATIVE mg/dL   Urobilinogen, UA >8.0 (*) 0.0 - 1.0 mg/dL   Nitrite NEGATIVE  NEGATIVE   Leukocytes, UA SMALL (*) NEGATIVE  URINE MICROSCOPIC-ADD ON     Status: None   Collection Time    04/27/13  2:39 PM      Result Value Ref Range   Squamous Epithelial / LPF RARE  RARE   WBC, UA 0-2  <3 WBC/hpf   RBC / HPF 0-2  <3 RBC/hpf   Bacteria, UA RARE  RARE  CBC WITH DIFFERENTIAL     Status: None   Collection Time    04/27/13  3:55 PM      Result Value Ref Range   WBC 7.6  4.0 - 10.5 K/uL   RBC 4.23  3.87 - 5.11 MIL/uL   Hemoglobin 12.4  12.0 - 15.0 g/dL   HCT 36.8  36.0 - 46.0 %   MCV 87.0  78.0 - 100.0 fL   MCH 29.3  26.0 - 34.0 pg   MCHC 33.7  30.0 - 36.0 g/dL   RDW 12.0  11.5 - 15.5 %   Platelets 289  150 - 400 K/uL   Neutrophils Relative % 66  43 - 77 %   Neutro Abs 5.0  1.7 - 7.7 K/uL   Lymphocytes Relative 27  12 - 46 %   Lymphs Abs 2.0  0.7 - 4.0 K/uL   Monocytes Relative 6  3 - 12 %   Monocytes Absolute 0.5  0.1 - 1.0 K/uL   Eosinophils   Relative 1  0 - 5 %   Eosinophils Absolute 0.0  0.0 - 0.7 K/uL   Basophils Relative 0  0 - 1 %   Basophils Absolute 0.0  0.0 - 0.1 K/uL  COMPREHENSIVE METABOLIC PANEL     Status: None   Collection Time    04/27/13  3:55 PM      Result Value Ref Range   Sodium 141  137 - 147 mEq/L   Potassium 4.2  3.7 - 5.3 mEq/L   Chloride 107  96 - 112 mEq/L   CO2 22  19 - 32 mEq/L   Glucose, Bld 90  70 - 99 mg/dL   BUN 13  6 - 23 mg/dL   Creatinine, Ser 0.80  0.50 - 1.10 mg/dL   Calcium 9.4  8.4 - 10.5 mg/dL   Total Protein 7.4  6.0 - 8.3 g/dL   Albumin 4.2  3.5 - 5.2 g/dL   AST 30  0 - 37 U/L   Comment: HEMOLYZED SPECIMEN, RESULTS MAY BE AFFECTED   ALT 25  0 - 35 U/L   Alkaline Phosphatase 83  39 - 117 U/L   Total Bilirubin 1.2  0.3 - 1.2 mg/dL   GFR calc non Af Amer >90  >90 mL/min   GFR calc Af Amer >90  >90 mL/min   Comment: (NOTE)     The eGFR has been calculated using the CKD EPI equation.     This calculation has not been validated in all clinical situations.     eGFR's persistently <90 mL/min signify possible Chronic Kidney     Disease.  LIPASE, BLOOD     Status: None   Collection Time    04/27/13  3:55 PM      Result Value Ref Range   Lipase 17  11 - 59 U/L   Ct Abdomen Pelvis W Contrast  04/27/2013   CLINICAL DATA:  Right lower quadrant abdominal pain  for 1 day. Nausea.  EXAM: CT ABDOMEN AND PELVIS WITH CONTRAST  TECHNIQUE: Multidetector CT imaging of the abdomen and pelvis was performed using the standard protocol following bolus administration of intravenous contrast.  CONTRAST:  50mL OMNIPAQUE IOHEXOL 300 MG/ML SOLN, 100mL OMNIPAQUE IOHEXOL 300 MG/ML SOLN  COMPARISON:  None.  FINDINGS: Liver, spleen, and adrenal glands unremarkable. Several small calcifications are present along the pancreatic parenchyma, and could reflect prior pancreatitis.  2 mm right kidney lower pole nonobstructive calculus. 6 mm cyst of the right mid kidney anteriorly. Kidneys otherwise unremarkable.  No pathologic upper abdominal adenopathy is observed. No pathologic pelvic adenopathy is observed. Appendiceal diameter 1.1 cm with periappendiceal stranding compatible with acute appendicitis.  Mild prominence of gas and stool in the colon.  2.9 cm rim calcified right fundal fibroid is subserosal in location.  Bone island in the left medial femoral head.  IMPRESSION: 1. Acute nonruptured appendicitis. 2. Nonobstructive right nephrolithiasis. 3. Several small calcifications along the pancreatic parenchyma, suspicious for chronic or remote pancreatitis. 4.  Prominent stool throughout the colon favors constipation. 5. Rim calcified subserosal right uterine fundal fibroid.   Electronically Signed   By: Walt  Liebkemann M.D.   On: 04/27/2013 17:31    Review of Systems  Constitutional: Negative for fever and chills.  HENT: Negative.   Eyes: Negative.   Respiratory: Negative.   Cardiovascular: Negative.   Gastrointestinal: Positive for abdominal pain.  Genitourinary: Negative.   Musculoskeletal: Negative.   Skin: Negative.   Neurological: Negative.   Endo/Heme/Allergies: Negative.     Psychiatric/Behavioral: Negative.     Blood pressure 130/86, pulse 82, temperature 98.3 F (36.8 C), temperature source Oral, resp. rate 18, height 5' 4" (1.626 m), weight 206 lb (93.441 kg), SpO2  100.00%. Physical Exam  Constitutional: She is oriented to person, place, and time. She appears well-developed and well-nourished.  HENT:  Head: Normocephalic.  Eyes: Pupils are equal, round, and reactive to light. No scleral icterus.  Neck: Normal range of motion.  Cardiovascular: Normal rate and regular rhythm.   Respiratory: Effort normal and breath sounds normal.  GI: Soft. There is tenderness. There is guarding and tenderness at McBurney's point. There is no rigidity and no rebound.  Neurological: She is alert and oriented to person, place, and time.  Skin: Skin is warm and dry.  Psychiatric: She has a normal mood and affect. Her behavior is normal. Judgment and thought content normal.     Assessment/Plan Acute appendicitis not perforated Kidney stones without obstruction  Not toxic appearing at this point.  Start antibiotics,  IVF,analgesia and plan on OR in am.  If improves dramatically on medical management,  May continue and manage as such.  Discussed this with patient and family.  If still with pain in am,   recommend laparoscopic appendectomy.  Dr Byerly the DOW this week and will follow up in am.   Thomas A. Cornett 04/27/2013, 9:21 PM    

## 2013-04-27 NOTE — ED Notes (Signed)
Unit secretary to page Dr. Luisa Hartornett, per orders

## 2013-04-27 NOTE — ED Notes (Signed)
Pt c/o mild discomfort when IV was flushed.  IV is patent, flushes easily, and positive blood return noted.

## 2013-04-27 NOTE — ED Notes (Signed)
Bed: [id number] Expected date:  Expected time:  Means of arrival:  Comments: [facility]/

## 2013-04-27 NOTE — ED Provider Notes (Signed)
[facility] [place] to see for appendicitis. The patient screened by me when she arrived patient alert nontoxic no acute distress. Contacted Dr. Luisa Hartornett he's aware of patient being in bed 19 he will come down to see the patient. He was expecting the patient to arrive.  Shelda JakesScott W. Adalynd Donahoe, MD 04/27/13 2104

## 2013-04-27 NOTE — ED Notes (Signed)
Pt having right sided lower abdominal pain since yesterday evening.  Pain is constant.  Movement makes pain worse.  Some nausea.  No fever.

## 2013-04-28 ENCOUNTER — Encounter (HOSPITAL_COMMUNITY)
Admission: EM | Disposition: A | Discharge status: Home / Self Care | Payer: BC Managed Care – PPO | Source: Home / Self Care | Attending: Emergency Medicine

## 2013-04-28 ENCOUNTER — Encounter (HOSPITAL_COMMUNITY): Payer: BC Managed Care – PPO | Admitting: Registered Nurse

## 2013-04-28 ENCOUNTER — Encounter (HOSPITAL_COMMUNITY): Payer: Self-pay | Admitting: General Surgery

## 2013-04-28 ENCOUNTER — Observation Stay (HOSPITAL_COMMUNITY): Payer: BC Managed Care – PPO | Admitting: Registered Nurse

## 2013-04-28 DIAGNOSIS — K3580 Unspecified acute appendicitis: Secondary | ICD-10-CM

## 2013-04-28 HISTORY — PX: LAPAROSCOPIC APPENDECTOMY: SHX408

## 2013-04-28 LAB — CBC
HCT: 31.3 % — ABNORMAL LOW (ref 36.0–46.0)
HEMOGLOBIN: 10.6 g/dL — AB (ref 12.0–15.0)
MCH: 28.6 pg (ref 26.0–34.0)
MCHC: 33.9 g/dL (ref 30.0–36.0)
MCV: 84.6 fL (ref 78.0–100.0)
PLATELETS: 273 10*3/uL (ref 150–400)
RBC: 3.7 MIL/uL — AB (ref 3.87–5.11)
RDW: 12.2 % (ref 11.5–15.5)
WBC: 5.8 10*3/uL (ref 4.0–10.5)

## 2013-04-28 LAB — COMPREHENSIVE METABOLIC PANEL
ALT: 19 U/L (ref 0–35)
AST: 17 U/L (ref 0–37)
Albumin: 3.2 g/dL — ABNORMAL LOW (ref 3.5–5.2)
Alkaline Phosphatase: 67 U/L (ref 39–117)
BUN: 9 mg/dL (ref 6–23)
CO2: 20 meq/L (ref 19–32)
Calcium: 8.5 mg/dL (ref 8.4–10.5)
Chloride: 109 mEq/L (ref 96–112)
Creatinine, Ser: 0.7 mg/dL (ref 0.50–1.10)
GFR calc Af Amer: >90 mL/min (ref >90)
Glucose, Bld: 98 mg/dL (ref 70–99)
Potassium: 3.5 mEq/L — ABNORMAL LOW (ref 3.7–5.3)
Sodium: 140 mEq/L (ref 137–147)
Total Bilirubin: 1.4 mg/dL — ABNORMAL HIGH (ref 0.3–1.2)
Total Protein: 6.3 g/dL (ref 6.0–8.3)

## 2013-04-28 LAB — SURGICAL PCR SCREEN
MRSA, PCR: NEGATIVE
Staphylococcus aureus: NEGATIVE

## 2013-04-28 SURGERY — APPENDECTOMY, LAPAROSCOPIC
Anesthesia: General | Site: Abdomen

## 2013-04-28 MED ORDER — GLYCOPYRROLATE 0.2 MG/ML IJ SOLN
INTRAMUSCULAR | Status: DC | PRN
  Administered 2013-04-28: .8 mg via INTRAVENOUS

## 2013-04-28 MED ORDER — MIDAZOLAM HCL 2 MG/2ML IJ SOLN
INTRAMUSCULAR | Status: AC
  Filled 2013-04-28: qty 2

## 2013-04-28 MED ORDER — EPHEDRINE SULFATE 50 MG/ML IJ SOLN
INTRAMUSCULAR | Status: AC
  Filled 2013-04-28: qty 1

## 2013-04-28 MED ORDER — PROMETHAZINE HCL 25 MG/ML IJ SOLN: 6.2500 mg | INTRAMUSCULAR | Status: DC | PRN

## 2013-04-28 MED ORDER — LIDOCAINE HCL (CARDIAC) 20 MG/ML IV SOLN
INTRAVENOUS | Status: DC | PRN
  Administered 2013-04-28: 100 mg via INTRAVENOUS

## 2013-04-28 MED ORDER — PROPOFOL 10 MG/ML IV BOLUS
INTRAVENOUS | Status: DC | PRN
  Administered 2013-04-28: 250 mg via INTRAVENOUS

## 2013-04-28 MED ORDER — ROCURONIUM BROMIDE 100 MG/10ML IV SOLN
INTRAVENOUS | Status: AC
  Filled 2013-04-28: qty 1

## 2013-04-28 MED ORDER — TOPIRAMATE 100 MG PO TABS
100.0000 mg | ORAL_TABLET | Freq: Every day | ORAL | Status: DC
  Administered 2013-04-28: 100 mg via ORAL
  Filled 2013-04-28: qty 1

## 2013-04-28 MED ORDER — FLUCONAZOLE 200 MG PO TABS: 200.0000 mg | ORAL_TABLET | Freq: Every day | ORAL | Status: DC

## 2013-04-28 MED ORDER — ENOXAPARIN SODIUM 40 MG/0.4ML ~~LOC~~ SOLN: 40.0000 mg | SUBCUTANEOUS | Status: DC

## 2013-04-28 MED ORDER — DEXAMETHASONE SODIUM PHOSPHATE 10 MG/ML IJ SOLN
INTRAMUSCULAR | Status: AC
  Filled 2013-04-28: qty 1

## 2013-04-28 MED ORDER — SUCCINYLCHOLINE CHLORIDE 20 MG/ML IJ SOLN
INTRAMUSCULAR | Status: DC | PRN
  Administered 2013-04-28: 120 mg via INTRAVENOUS

## 2013-04-28 MED ORDER — LIDOCAINE HCL (PF) 1 % IJ SOLN
INTRAMUSCULAR | Status: DC | PRN
  Administered 2013-04-28: 15 mL

## 2013-04-28 MED ORDER — ROCURONIUM BROMIDE 100 MG/10ML IV SOLN
INTRAVENOUS | Status: DC | PRN
  Administered 2013-04-28: 40 mg via INTRAVENOUS

## 2013-04-28 MED ORDER — KETOROLAC TROMETHAMINE 30 MG/ML IJ SOLN
INTRAMUSCULAR | Status: AC
  Filled 2013-04-28: qty 1

## 2013-04-28 MED ORDER — KETOROLAC TROMETHAMINE 30 MG/ML IJ SOLN
15.0000 mg | Freq: Once | INTRAMUSCULAR | Status: AC | PRN
  Administered 2013-04-28: 30 mg via INTRAVENOUS

## 2013-04-28 MED ORDER — HYDROMORPHONE HCL PF 1 MG/ML IJ SOLN: 0.2500 mg | INTRAMUSCULAR | Status: DC | PRN

## 2013-04-28 MED ORDER — NEOSTIGMINE METHYLSULFATE 1 MG/ML IJ SOLN
INTRAMUSCULAR | Status: AC
  Filled 2013-04-28: qty 10

## 2013-04-28 MED ORDER — IBUPROFEN 800 MG PO TABS: 800.0000 mg | ORAL_TABLET | Freq: Three times a day (TID) | ORAL | Status: DC | PRN

## 2013-04-28 MED ORDER — METOCLOPRAMIDE HCL 5 MG/ML IJ SOLN
INTRAMUSCULAR | Status: AC
  Filled 2013-04-28: qty 2

## 2013-04-28 MED ORDER — DEXAMETHASONE SODIUM PHOSPHATE 10 MG/ML IJ SOLN
INTRAMUSCULAR | Status: DC | PRN
  Administered 2013-04-28: 10 mg via INTRAVENOUS

## 2013-04-28 MED ORDER — BUPIVACAINE-EPINEPHRINE (PF) 0.25% -1:200000 IJ SOLN
INTRAMUSCULAR | Status: AC
  Filled 2013-04-28: qty 30

## 2013-04-28 MED ORDER — OXYCODONE-ACETAMINOPHEN 5-325 MG PO TABS: 1.0000 | ORAL_TABLET | ORAL | Status: DC | PRN

## 2013-04-28 MED ORDER — LACTATED RINGERS IV SOLN
INTRAVENOUS | Status: DC | PRN
  Administered 2013-04-28: 07:00:00 via INTRAVENOUS

## 2013-04-28 MED ORDER — LIDOCAINE HCL 1 % IJ SOLN
INTRAMUSCULAR | Status: AC
Start: 2013-04-28 — End: 2013-04-28
  Filled 2013-04-28: qty 20

## 2013-04-28 MED ORDER — BUPIVACAINE-EPINEPHRINE 0.25% -1:200000 IJ SOLN
INTRAMUSCULAR | Status: AC
  Filled 2013-04-28: qty 1

## 2013-04-28 MED ORDER — SODIUM CHLORIDE 0.9 % IJ SOLN
INTRAMUSCULAR | Status: AC
  Filled 2013-04-28: qty 10

## 2013-04-28 MED ORDER — LIDOCAINE HCL (CARDIAC) 20 MG/ML IV SOLN
INTRAVENOUS | Status: AC
  Filled 2013-04-28: qty 5

## 2013-04-28 MED ORDER — DEXTROSE IN LACTATED RINGERS 5 % IV SOLN
INTRAVENOUS | Status: DC
Start: 2013-04-28 — End: 2013-04-28
  Administered 2013-04-28: 14:00:00 via INTRAVENOUS

## 2013-04-28 MED ORDER — LACTATED RINGERS IV SOLN
INTRAVENOUS | Status: DC | PRN
Start: 2013-04-28 — End: 2013-04-28
  Administered 2013-04-28: 1000 mL via INTRAVENOUS

## 2013-04-28 MED ORDER — NEOSTIGMINE METHYLSULFATE 1 MG/ML IJ SOLN
INTRAMUSCULAR | Status: DC | PRN
  Administered 2013-04-28: 5 mg via INTRAVENOUS

## 2013-04-28 MED ORDER — FENTANYL CITRATE 0.05 MG/ML IJ SOLN
INTRAMUSCULAR | Status: DC | PRN
  Administered 2013-04-28 (×4): 50 ug via INTRAVENOUS

## 2013-04-28 MED ORDER — BUPIVACAINE-EPINEPHRINE 0.25% -1:200000 IJ SOLN
INTRAMUSCULAR | Status: DC | PRN
  Administered 2013-04-28: 15 mL

## 2013-04-28 MED ORDER — TRAMADOL HCL 50 MG PO TABS: 50.0000 mg | ORAL_TABLET | Freq: Four times a day (QID) | ORAL | Status: DC | PRN

## 2013-04-28 MED ORDER — OXYCODONE-ACETAMINOPHEN 5-325 MG PO TABS
1.0000 | ORAL_TABLET | ORAL | Status: DC | PRN
  Administered 2013-04-28: 1 via ORAL
  Filled 2013-04-28: qty 1

## 2013-04-28 MED ORDER — PROPOFOL 10 MG/ML IV BOLUS
INTRAVENOUS | Status: AC
  Filled 2013-04-28: qty 20

## 2013-04-28 MED ORDER — FENTANYL CITRATE 0.05 MG/ML IJ SOLN
INTRAMUSCULAR | Status: AC
  Filled 2013-04-28: qty 5

## 2013-04-28 MED ORDER — METOCLOPRAMIDE HCL 5 MG/ML IJ SOLN
INTRAMUSCULAR | Status: DC | PRN
  Administered 2013-04-28: 10 mg via INTRAVENOUS

## 2013-04-28 MED ORDER — ONDANSETRON HCL 4 MG/2ML IJ SOLN
INTRAMUSCULAR | Status: AC
  Filled 2013-04-28: qty 2

## 2013-04-28 MED ORDER — MIDAZOLAM HCL 5 MG/5ML IJ SOLN
INTRAMUSCULAR | Status: DC | PRN
  Administered 2013-04-28: 2 mg via INTRAVENOUS

## 2013-04-28 MED ORDER — HYDROMORPHONE HCL PF 1 MG/ML IJ SOLN
0.5000 mg | INTRAMUSCULAR | Status: DC | PRN
  Administered 2013-04-28: 0.5 mg via INTRAVENOUS
  Filled 2013-04-28: qty 1

## 2013-04-28 MED ORDER — POLYETHYLENE GLYCOL 3350 17 G PO PACK
17.0000 g | PACK | Freq: Every day | ORAL | Status: DC | PRN
  Filled 2013-04-28: qty 1

## 2013-04-28 MED ORDER — GLYCOPYRROLATE 0.2 MG/ML IJ SOLN
INTRAMUSCULAR | Status: AC
  Filled 2013-04-28: qty 4

## 2013-04-28 MED ORDER — ONDANSETRON HCL 4 MG/2ML IJ SOLN
INTRAMUSCULAR | Status: DC | PRN
  Administered 2013-04-28: 4 mg via INTRAVENOUS

## 2013-04-28 SURGICAL SUPPLY — 44 items
APPLIER CLIP ROT 10 11.4 M/L (STAPLE)
BENZOIN TINCTURE PRP APPL 2/3 (GAUZE/BANDAGES/DRESSINGS) IMPLANT
CANISTER SUCTION 2500CC (MISCELLANEOUS) IMPLANT
CLIP APPLIE ROT 10 11.4 M/L (STAPLE) IMPLANT
CLOSURE WOUND 1/2 X4 (GAUZE/BANDAGES/DRESSINGS) ×1
CUTTER FLEX LINEAR 45M (STAPLE) ×3 IMPLANT
DECANTER SPIKE VIAL GLASS SM (MISCELLANEOUS) ×3 IMPLANT
DERMABOND ADVANCED (GAUZE/BANDAGES/DRESSINGS) ×2
DERMABOND ADVANCED .7 DNX12 (GAUZE/BANDAGES/DRESSINGS) ×1 IMPLANT
DRAPE LAPAROSCOPIC ABDOMINAL (DRAPES) ×3 IMPLANT
DRAPE WARM FLUID 44X44 (DRAPE) IMPLANT
DRSG TEGADERM 2-3/8X2-3/4 SM (GAUZE/BANDAGES/DRESSINGS) IMPLANT
DRSG TEGADERM 4X4.75 (GAUZE/BANDAGES/DRESSINGS) IMPLANT
ELECT REM PT RETURN 9FT ADLT (ELECTROSURGICAL) ×3
ELECTRODE REM PT RTRN 9FT ADLT (ELECTROSURGICAL) ×1 IMPLANT
ENDOLOOP SUT PDS II  0 18 (SUTURE)
ENDOLOOP SUT PDS II 0 18 (SUTURE) IMPLANT
GLOVE BIO SURGEON STRL SZ 6 (GLOVE) ×3 IMPLANT
GLOVE BIOGEL PI IND STRL 6.5 (GLOVE) ×1 IMPLANT
GLOVE BIOGEL PI IND STRL 7.0 (GLOVE) ×1 IMPLANT
GLOVE BIOGEL PI INDICATOR 6.5 (GLOVE) ×2
GLOVE BIOGEL PI INDICATOR 7.0 (GLOVE) ×2
GLOVE INDICATOR 6.5 STRL GRN (GLOVE) ×6 IMPLANT
GOWN STRL REUS W/ TWL XL LVL3 (GOWN DISPOSABLE) ×4 IMPLANT
GOWN STRL REUS W/TWL XL LVL3 (GOWN DISPOSABLE) ×8
KIT BASIN OR (CUSTOM PROCEDURE TRAY) ×3 IMPLANT
NS IRRIG 1000ML POUR BTL (IV SOLUTION) ×3 IMPLANT
PENCIL BUTTON HOLSTER BLD 10FT (ELECTRODE) IMPLANT
POUCH SPECIMEN RETRIEVAL 10MM (ENDOMECHANICALS) ×3 IMPLANT
RELOAD 45 VASCULAR/THIN (ENDOMECHANICALS) IMPLANT
RELOAD STAPLE TA45 3.5 REG BLU (ENDOMECHANICALS) ×3 IMPLANT
SCALPEL HARMONIC ACE (MISCELLANEOUS) ×3 IMPLANT
SET IRRIG TUBING LAPAROSCOPIC (IRRIGATION / IRRIGATOR) ×3 IMPLANT
SOLUTION ANTI FOG 6CC (MISCELLANEOUS) ×3 IMPLANT
STRIP CLOSURE SKIN 1/2X4 (GAUZE/BANDAGES/DRESSINGS) ×2 IMPLANT
SUT MNCRL AB 4-0 PS2 18 (SUTURE) ×3 IMPLANT
SUT VICRYL 0 ENDOLOOP (SUTURE) IMPLANT
TOWEL OR 17X26 10 PK STRL BLUE (TOWEL DISPOSABLE) ×3 IMPLANT
TRAY FOLEY CATH 14FRSI W/METER (CATHETERS) ×3 IMPLANT
TRAY LAP CHOLE (CUSTOM PROCEDURE TRAY) ×3 IMPLANT
TROCAR BLADELESS OPT 5 75 (ENDOMECHANICALS) ×6 IMPLANT
TROCAR XCEL BLUNT TIP 100MML (ENDOMECHANICALS) ×3 IMPLANT
TROCAR XCEL NON-BLD 11X100MML (ENDOMECHANICALS) IMPLANT
TUBING INSUFFLATION 10FT LAP (TUBING) ×3 IMPLANT

## 2013-04-28 NOTE — Progress Notes (Signed)
Pt tolerated solid foods with no issues. Discharged to home per MD orders. Pt did ask how long she should be out of work and she requested medication for yeast infection since she had received "so much antibiotics while in the hospital." On call MD was not familiar with pt's case and stated for her to return to work in 2 weeks and if she had any concerns about this time frame for her to contact the office tomorrow to speak with the MD who performed the surgery. He also electronically sent a prescription to pt's listed pharmacy for medication for yeast infection. Pt seemed pleased with these decisions and had no further concerns at time of discharge.   Cyndy FreezeJessica Advith Martine Deutsch 04/28/2013 6:34 PM

## 2013-04-28 NOTE — Op Note (Signed)
Laparoscopic appendectomy  Indications: The patient presented with a history of right-sided abdominal pain. A CT revealed findings consistent with acute appendicitis.  Pre-operative Diagnosis: Acute appendicitis without mention of peritonitis  Post-operative Diagnosis: Same  Surgeon: Almond LintFaera Medora Roorda   Assistants: n/a  Anesthesia: General endotracheal anesthesia and Local anesthesia 1% plain lidocaine, 0.25.% bupivacaine, with epinephrine  ASA Class: 2  Procedure Details  The patient was seen again in the Holding Room. The risks, benefits, complications, treatment options, and expected outcomes were discussed with the patient and/or family. The possibilities of perforation of viscus, bleeding, recurrent infection, the need for additional procedures, failure to diagnose a condition, and creating a complication requiring transfusion or operation were discussed. There was concurrence with the proposed plan and informed consent was obtained. The site of surgery was properly noted. The patient was taken to Operating Room, identified as Katherine Patel and the procedure verified as Appendectomy. A Time Out was held and the above information confirmed.  The patient was placed in the supine position and general anesthesia was induced, along with placement of orogastric tube, Venodyne boots, and a Foley catheter. The abdomen was prepped and draped in a sterile fashion. Local anesthetic was infiltrated in the infraumbilical region.  A 1.5 cm vertical incision was made just below the umbilicus.  The Kelly clamp was used to spread the subcutaneous tissues.  The fascia was elevated with 2 Kocher clamps and incised with the #11 blade.  A Tresa EndoKelly was used to confirm entrance into the peritoneal cavity.  A pursestring suture was placed around the fascial incision.  The Hasson trocar was inserted into the abdomen and held in place with the tails of the suture.  The pneumoperitoneum was then established to steady  pressure of 15 mmHg.     Additional 5 mm cannulas then placed in the left lower quadrant of the abdomen and the suprapubic region under direct visualization.  A careful evaluation of the entire abdomen was carried out. The patient was placed in Trendelenburg and rotated to the left.  The small intestines were retracted in the cephalad and left lateral direction away from the pelvis and right lower quadrant. The patient was found to have an enlarged and inflamed appendix that was extending into the pelvis. There was no evidence of perforation.  The appendix was carefully dissected. The appendix was was skeletonized with the harmonic scalpel.   The appendix was divided at its base using an endo-GIA stapler. Minimal appendiceal stump was left in place. The appendix was removed from the abdomen with an Endocatch bag through the umbilical port.  There was no evidence of bleeding, leakage, or complication after division of the appendix. Irrigation was also performed and irrigate suctioned from the abdomen as well.  The 5 mm trocars were removed.  The pneumoperitoneum was evacuated from the abdomen.    The pursestring suture was tied down at the umbilicus. There was an additional fascial defect, so a second 0-0 vicryl was used to close the fascial defect.    The trocar site skin wounds were closed with 4-0 Monocryl and dressed with Dermabond.  Instrument, sponge, and needle counts were correct at the conclusion of the case.   Findings: The appendix was found to be inflamed. There were not signs of necrosis.  There was not perforation. There was not abscess formation.  Estimated Blood Loss:  Minimal         Specimens: appendix to pathology         Complications:  None; patient tolerated the procedure well.         Disposition: PACU - hemodynamically stable.         Condition: stable

## 2013-04-28 NOTE — Progress Notes (Signed)
UR completed 

## 2013-04-28 NOTE — Anesthesia Postprocedure Evaluation (Signed)
  Anesthesia Post-op Note  Patient: Katherine Patel  Procedure(s) Performed: Procedure(s) (LRB): APPENDECTOMY LAPAROSCOPIC (N/A)  Patient Location: PACU  Anesthesia Type: General  Level of Consciousness: awake and alert   Airway and Oxygen Therapy: Patient Spontanous Breathing  Post-op Pain: mild  Post-op Assessment: Post-op Vital signs reviewed, Patient's Cardiovascular Status Stable, Respiratory Function Stable, Patent Airway and No signs of Nausea or vomiting  Last Vitals:  Filed Vitals:   04/28/13 1234  BP: 126/81  Pulse: 83  Temp: 36.7 C  Resp: 18    Post-op Vital Signs: stable   Complications: No apparent anesthesia complications

## 2013-04-28 NOTE — Discharge Summary (Signed)
Patient ID: Katherine Patel MRN: [id number] DOB/AGE: 29/02/1985 29 y.o.  Admit date: 04/27/2013 Discharge date: 04/28/2013  Procedures: lap appy  Consults: None  Reason for Admission:  Transferred from [facility] with 24 hours of diffuse now RLQ abdominal pain. Started last night. Sharp location RLQ no radiation. No nausea or vomiting currently. Pain intensity now is 0 but ranging from 0 -8. No fever or chills.   Admission Diagnoses:  1. Acute appendicitis  Hospital Course: The patient was admitted and taken to the OR for a lap appy.  The patient tolerated the procedure well.  On POD 0, she was tolerating a solid diet and her pain was well controlled.  She was felt stable for dc home.  PE: Abd: soft, appropriately tender, all incisions c/d/i with dermabond, +BS, ND  Discharge Diagnoses:  Active Problems:   Appendicitis s/p lap appy  Discharge Medications:   Medication List    STOP taking these medications       traMADol 50 MG tablet  Commonly known as:  ULTRAM      TAKE these medications       ibuprofen 800 MG tablet  Commonly known as:  ADVIL,MOTRIN  Take 800 mg by mouth every 8 (eight) hours as needed (pain).     NEXPLANON 68 MG Impl implant  Generic drug:  etonogestrel  Inject 1 each into the skin once.     oxyCODONE-acetaminophen 5-325 MG per tablet  Commonly known as:  ROXICET  Take 1-2 tablets by mouth every 4 (four) hours as needed for severe pain.     phentermine 37.5 MG capsule  Take 37.5 mg by mouth every morning.     polyethylene glycol packet  Commonly known as:  MIRALAX / GLYCOLAX  Take 17 g by mouth daily as needed. constipation     topiramate 100 MG tablet  Commonly known as:  TOPAMAX  Take 100 mg by mouth daily.        Discharge Instructions:     Follow-up Information   Follow up with [facility] On 05/20/2013. (3:00pm, arrive by 2:30pm for paperwork)    Contact information:   [address]    [place] [address] [phone]       Signed: Letha CapeKelly E Angi Goodell 04/28/2013, 4:13 PM

## 2013-04-28 NOTE — Anesthesia Preprocedure Evaluation (Addendum)
Anesthesia Evaluation  Patient identified by MRN, date of birth, ID band Patient awake    Reviewed: Allergy & Precautions, H&P , NPO status , Patient's Chart, lab work & pertinent test results  History of Anesthesia Complications (+) AWARENESS UNDER ANESTHESIA  Airway Mallampati: II TM Distance: <3 FB Neck ROM: Full    Dental no notable dental hx.    Pulmonary neg pulmonary ROS,  breath sounds clear to auscultation  Pulmonary exam normal       Cardiovascular negative cardio ROS  Rhythm:Regular Rate:Normal     Neuro/Psych negative neurological ROS  negative psych ROS   GI/Hepatic negative GI ROS, Neg liver ROS,   Endo/Other  negative endocrine ROS  Renal/GU negative Renal ROS  negative genitourinary   Musculoskeletal negative musculoskeletal ROS (+)   Abdominal   Peds negative pediatric ROS (+)  Hematology  (+) anemia ,   Anesthesia Other Findings   Reproductive/Obstetrics negative OB ROS                          Anesthesia Physical Anesthesia Plan  ASA: II  Anesthesia Plan: General   Post-op Pain Management:    Induction: Intravenous and Rapid sequence  Airway Management Planned: Oral ETT  Additional Equipment:   Intra-op Plan:   Post-operative Plan: Extubation in OR  Informed Consent: I have reviewed the patients History and Physical, chart, labs and discussed the procedure including the risks, benefits and alternatives for the proposed anesthesia with the patient or authorized representative who has indicated his/her understanding and acceptance.   Dental advisory given  Plan Discussed with: CRNA and Surgeon  Anesthesia Plan Comments:         Anesthesia Quick Evaluation

## 2013-04-28 NOTE — Discharge Instructions (Signed)
LAPAROSCOPIC SURGERY: POST OP INSTRUCTIONS ° °1. DIET: Follow a light bland diet the first 24 hours after arrival home, such as soup, liquids, crackers, etc.  Be sure to include lots of fluids daily.  Avoid fast food or heavy meals as your are more likely to get nauseated.  Eat a low fat the next few days after surgery.   °2. Take your usually prescribed home medications unless otherwise directed. °3. PAIN CONTROL: °a. Pain is best controlled by a usual combination of three different methods TOGETHER: °i. Ice/Heat °ii. Over the counter pain medication °iii. Prescription pain medication °b. Most patients will experience some swelling and bruising around the incisions.  Ice packs or heating pads (30-60 minutes up to 6 times a day) will help. Use ice for the first few days to help decrease swelling and bruising, then switch to heat to help relax tight/sore spots and speed recovery.  Some people prefer to use ice alone, heat alone, alternating between ice & heat.  Experiment to what works for you.  Swelling and bruising can take several weeks to resolve.   °c. It is helpful to take an over-the-counter pain medication regularly for the first few weeks.  Choose one of the following that works best for you: °i. Naproxen (Aleve, etc)  Two 220mg tabs twice a day °ii. Ibuprofen (Advil, etc) Three 200mg tabs four times a day (every meal & bedtime) °iii. Acetaminophen (Tylenol, etc) 500-650mg four times a day (every meal & bedtime) °d. A  prescription for pain medication (such as oxycodone, hydrocodone, etc) should be given to you upon discharge.  Take your pain medication as prescribed.  °i. If you are having problems/concerns with the prescription medicine (does not control pain, nausea, vomiting, rash, itching, etc), please call us [phone] to see if we need to switch you to a different pain medicine that will work better for you and/or control your side effect better. °ii. If you need a refill on your pain medication,  please contact your pharmacy.  They will contact our office to request authorization. Prescriptions will not be filled after 5 pm or on week-ends. °4. Avoid getting constipated.  Between the surgery and the pain medications, it is common to experience some constipation.  Increasing fluid intake and taking a fiber supplement (such as Metamucil, Citrucel, FiberCon, MiraLax, etc) 1-2 times a day regularly will usually help prevent this problem from occurring.  A mild laxative (prune juice, Milk of Magnesia, MiraLax, etc) should be taken according to package directions if there are no bowel movements after 48 hours.   °5. Watch out for diarrhea.  If you have many loose bowel movements, simplify your diet to bland foods & liquids for a few days.  Stop any stool softeners and decrease your fiber supplement.  Switching to mild anti-diarrheal medications (Kayopectate, Pepto Bismol) can help.  If this worsens or does not improve, please call us. °6. Wash / shower every day.  You may shower over the dressings as they are waterproof.  Continue to shower over incision(s) after the dressing is off. °7. Remove your waterproof bandages 5 days after surgery.  You may leave the incision open to air.  You may replace a dressing/Band-Aid to cover the incision for comfort if you wish.  °8. ACTIVITIES as tolerated:   °a. You may resume regular (light) daily activities beginning the next day--such as daily self-care, walking, climbing stairs--gradually increasing activities as tolerated.  If you can walk 30 minutes without difficulty, it   is safe to try more intense activity such as jogging, treadmill, bicycling, low-impact aerobics, swimming, etc. b. Save the most intensive and strenuous activity for last such as sit-ups, heavy lifting, contact sports, etc  Refrain from any heavy lifting or straining until you are off narcotics for pain control.   c. DO NOT PUSH THROUGH PAIN.  Let pain be your guide: If it hurts to do something, don't  do it.  Pain is your body warning you to avoid that activity for another week until the pain goes down. d. You may drive when you are no longer taking prescription pain medication, you can comfortably wear a seatbelt, and you can safely maneuver your car and apply brakes. e. Dennis Bast may have sexual intercourse when it is comfortable.  9. FOLLOW UP in our office a. Please call [facility] at [phone] to set up an appointment to see your surgeon in the office for a follow-up appointment approximately 2-3 weeks after your surgery. b. Make sure that you call for this appointment the day you arrive home to insure a convenient appointment time. 10. IF YOU HAVE DISABILITY OR FAMILY LEAVE FORMS, BRING THEM TO THE OFFICE FOR PROCESSING.  DO NOT GIVE THEM TO YOUR DOCTOR.   WHEN TO CALL us [phone]: 1. Poor pain control 2. Reactions / problems with new medications (rash/itching, nausea, etc)  3. Fever over 101.5 F (38.5 C) 4. Inability to urinate 5. Nausea and/or vomiting 6. Worsening swelling or bruising 7. Continued bleeding from incision. 8. Increased pain, redness, or drainage from the incision   The clinic staff is available to answer your questions during regular business hours (8:30am-5pm).  Please dont hesitate to call and ask to speak to one of our nurses for clinical concerns.   If you have a medical emergency, go to the nearest emergency room or call 911.  A surgeon from [facility] is always on call at the [facility], [place], [place], [place], [place]  [address] ? MAIN: [phone] ? TOLL FREE: [phone] ?  FAX [phone] [email or web address]  GETTING TO GOOD BOWEL HEALTH. Irregular bowel habits such as constipation and diarrhea can lead to many problems over time.  Having one soft bowel movement a day is the most important way to prevent further problems.  The anorectal canal is designed to handle  stretching and feces to safely manage our ability to get rid of solid waste (feces, poop, stool) out of our body.  BUT, hard constipated stools can act like ripping concrete bricks and diarrhea can be a burning fire to this very sensitive area of our body, causing inflamed hemorrhoids, anal fissures, increasing risk is perirectal abscesses, abdominal pain/bloating, an making irritable bowel worse.     The goal: ONE SOFT BOWEL MOVEMENT A DAY!  To have soft, regular bowel movements:    Drink at least 8 tall glasses of water a day.     Take plenty of fiber.  Fiber is the undigested part of plant food that passes into the colon, acting s natures broom to encourage bowel motility and movement.  Fiber can absorb and hold large amounts of water. This results in a larger, bulkier stool, which is soft and easier to pass. Work gradually over several weeks up to 6 servings a day of fiber (25g a day even more if needed) in the form of: o Vegetables -- Root (potatoes, carrots, turnips), leafy green (lettuce, salad greens, celery,  spinach), or cooked high residue (cabbage, broccoli, etc) o Fruit -- Fresh (unpeeled skin & pulp), Dried (prunes, apricots, cherries, etc ),  or stewed ( applesauce)  o Whole grain breads, pasta, etc (whole wheat)  o Bran cereals    Bulking Agents -- This type of water-retaining fiber generally is easily obtained each day by one of the following:  o Psyllium bran -- The psyllium plant is remarkable because its ground seeds can retain so much water. This product is available as Metamucil, Konsyl, Effersyllium, Per Diem Fiber, or the less expensive generic preparation in drug and health food stores. Although labeled a laxative, it really is not a laxative.  o Methylcellulose -- This is another fiber derived from wood which also retains water. It is available as Citrucel. o Polyethylene Glycol - and artificial fiber commonly called Miralax or Glycolax.  It is helpful for people with gassy or  bloated feelings with regular fiber o Flax Seed - a less gassy fiber than psyllium   No reading or other relaxing activity while on the toilet. If bowel movements take longer than 5 minutes, you are too constipated   AVOID CONSTIPATION.  High fiber and water intake usually takes care of this.  Sometimes a laxative is needed to stimulate more frequent bowel movements, but    Laxatives are not a good long-term solution as it can wear the colon out. o Osmotics (Milk of Magnesia, Fleets phosphosoda, Magnesium citrate, MiraLax, GoLytely) are safer than  o Stimulants (Senokot, Castor Oil, Dulcolax, Ex Lax)    o Do not take laxatives for more than 7days in a row.    IF SEVERELY CONSTIPATED, try a Bowel Retraining Program: o Do not use laxatives.  o Eat a diet high in roughage, such as bran cereals and leafy vegetables.  o Drink six (6) ounces of prune or apricot juice each morning.  o Eat two (2) large servings of stewed fruit each day.  o Take one (1) heaping tablespoon of a psyllium-based bulking agent twice a day. Use sugar-free sweetener when possible to avoid excessive calories.  o Eat a normal breakfast.  o Set aside 15 minutes after breakfast to sit on the toilet, but do not strain to have a bowel movement.  o If you do not have a bowel movement by the third day, use an enema and repeat the above steps.    Controlling diarrhea o Switch to liquids and simpler foods for a few days to avoid stressing your intestines further. o Avoid dairy products (especially milk & ice cream) for a short time.  The intestines often can lose the ability to digest lactose when stressed. o Avoid foods that cause gassiness or bloating.  Typical foods include beans and other legumes, cabbage, broccoli, and dairy foods.  Every person has some sensitivity to other foods, so listen to our body and avoid those foods that trigger problems for you. o Adding fiber (Citrucel, Metamucil, psyllium, Miralax) gradually can help  thicken stools by absorbing excess fluid and retrain the intestines to act more normally.  Slowly increase the dose over a few weeks.  Too much fiber too soon can backfire and cause cramping & bloating. o Probiotics (such as active yogurt, Align, etc) may help repopulate the intestines and colon with normal bacteria and calm down a sensitive digestive tract.  Most studies show it to be of mild help, though, and such products can be costly. o Medicines:   Bismuth subsalicylate (ex. Kayopectate, [place]) every  30 minutes for up to 6 doses can help control diarrhea.  Avoid if pregnant.   Loperamide (Immodium) can slow down diarrhea.  Start with two tablets (4mg  total) first and then try one tablet every 6 hours.  Avoid if you are having fevers or severe pain.  If you are not better or start feeling worse, stop all medicines and call your doctor for advice o Call your doctor if you are getting worse or not better.  Sometimes further testing (cultures, endoscopy, X-ray studies, bloodwork, etc) may be needed to help diagnose and treat the cause of the diarrhea.   Appendicitis Appendicitis is when the appendix is swollen (inflamed). The inflammation can lead to developing a hole (perforation) and a collection of pus (abscess). CAUSES  There is not always an obvious cause of appendicitis. Sometimes it is caused by an obstruction in the appendix. The obstruction can be caused by:  A small, hard, pea-sized ball of stool (fecalith).  Enlarged lymph glands in the appendix. SYMPTOMS   Pain around your belly button (navel) that moves toward your lower right belly (abdomen). The pain can become more severe and sharp as time passes.  Tenderness in the lower right abdomen. Pain gets worse if you cough or make a sudden movement.  Feeling sick to your stomach (nauseous).  Throwing up (vomiting).  Loss of appetite.  Fever.  Constipation.  Diarrhea.  Generally not feeling well. DIAGNOSIS    Physical exam.  Blood tests.  Urine test.  X-rays or a CT scan may confirm the diagnosis. TREATMENT  Once the diagnosis of appendicitis is made, the most common treatment is to remove the appendix as soon as possible. This procedure is called appendectomy. In an open appendectomy, a cut (incision) is made in the lower right abdomen and the appendix is removed. In a laparoscopic appendectomy, usually 3 small incisions are made. Long, thin instruments and a camera tube are used to remove the appendix. Most patients go home in 24 to 48 hours after appendectomy. In some situations, the appendix may have already perforated and an abscess may have formed. The abscess may have a "wall" around it as seen on a CT scan. In this case, a drain may be placed into the abscess to remove fluid, and you may be treated with antibiotic medicines that kill germs. The medicine is given through a tube in your vein (IV). Once the abscess has resolved, it may or may not be necessary to have an appendectomy. You may need to stay in the hospital longer than 48 hours. Document Released: 12/26/2004 Document Revised: 06/27/2011 Document Reviewed: 03/23/2009 [facility] Patient Information 2014 [place], [place].

## 2013-04-28 NOTE — Interval H&P Note (Signed)
History and Physical Interval Note:  04/28/2013 7:13 AM  Katherine MillersNatasha Anand  has presented today for surgery, with the diagnosis of appendicitis  The various methods of treatment have been discussed with the patient and family. After consideration of risks, benefits and other options for treatment, the patient has consented to  Procedure(s): APPENDECTOMY LAPAROSCOPIC (N/A) as a surgical intervention .  The patient's history has been reviewed, patient examined, no change in status, stable for surgery.  I have reviewed the patient's chart and labs.  Questions were answered to the patient's satisfaction.     Almond LintFaera Maclane Holloran

## 2013-04-28 NOTE — Transfer of Care (Signed)
Immediate Anesthesia Transfer of Care Note  Patient: Katherine Patel  Procedure(s) Performed: Procedure(s): APPENDECTOMY LAPAROSCOPIC (N/A)  Patient Location: PACU  Anesthesia Type:General  Level of Consciousness: awake, alert , oriented and patient cooperative  Airway & Oxygen Therapy: Patient Spontanous Breathing and Patient connected to face mask oxygen  Post-op Assessment: Report given to PACU RN, Post -op Vital signs reviewed and stable and Patient moving all extremities  Post vital signs: Reviewed and stable  Complications: No apparent anesthesia complications

## 2013-04-29 ENCOUNTER — Encounter (HOSPITAL_COMMUNITY): Payer: Self-pay | Admitting: General Surgery

## 2013-04-29 NOTE — Discharge Summary (Signed)
Seen and agree  

## 2013-05-12 ENCOUNTER — Other Ambulatory Visit (INDEPENDENT_AMBULATORY_CARE_PROVIDER_SITE_OTHER): Payer: Self-pay

## 2013-05-12 ENCOUNTER — Telehealth (INDEPENDENT_AMBULATORY_CARE_PROVIDER_SITE_OTHER): Payer: Self-pay

## 2013-05-12 DIAGNOSIS — R112 Nausea with vomiting, unspecified: Secondary | ICD-10-CM

## 2013-05-12 DIAGNOSIS — Z9889 Other specified postprocedural states: Principal | ICD-10-CM

## 2013-05-12 LAB — CBC WITH DIFFERENTIAL/PLATELET
Basophils Absolute: 0 10*3/uL (ref 0.0–0.1)
Eosinophils Absolute: 0 10*3/uL (ref 0.0–0.7)
HCT: 38.8 % (ref 36.0–46.0)
Hemoglobin: 12.2 g/dL (ref 12.0–15.0)
LYMPHS PCT: 45 % (ref 12–46)
Lymphs Abs: 3.6 10*3/uL (ref 0.7–4.0)
MCH: 27.5 pg (ref 26.0–34.0)
MCHC: 31.4 g/dL (ref 30.0–36.0)
MCV: 87.5 fL (ref 78.0–100.0)
Monocytes Absolute: 0.4 10*3/uL (ref 0.1–1.0)
Monocytes Relative: 5 % (ref 3–12)
NEUTROS ABS: 4 10*3/uL (ref 1.7–7.7)
NEUTROS PCT: 50 % (ref 43–77)
PLATELETS: 350 10*3/uL (ref 150–400)
RBC: 4.44 MIL/uL (ref 3.87–5.11)
RDW: 12.7 % (ref 11.5–15.5)
WBC: 7.9 10*3/uL (ref 4.0–10.5)

## 2013-05-12 LAB — BASIC METABOLIC PANEL
BUN: 12 mg/dL (ref 6–23)
CO2: 21 mEq/L (ref 19–32)
Calcium: 9.1 mg/dL (ref 8.4–10.5)
Chloride: 106 mEq/L (ref 96–112)
Creat: 0.9 mg/dL (ref 0.50–1.10)
GLUCOSE: 104 mg/dL — AB (ref 70–99)
POTASSIUM: 3.9 meq/L (ref 3.5–5.3)
SODIUM: 141 meq/L (ref 135–145)

## 2013-05-12 NOTE — Telephone Encounter (Signed)
Get CBC and BMET.  If abnormal, we will get CT.  Would also treat with miralax.

## 2013-05-12 NOTE — Telephone Encounter (Signed)
Pt returned call. Per Dr Arita MissByerly's order in epic pt will go to lab now to have Cmet and Bmet done as stat. Pt advised pending these lab results pt may be scheduled for CT later today or tomorrow. Pt will stay on clear liquids to help with nausea  until labs come back. Pt advised if she has not heard from our office by 10:00am tomorrow she needs to call out office for results. Pt also advised if she starts vomiting after hours she should go to ER. Pt states she understands.

## 2013-05-12 NOTE — Telephone Encounter (Signed)
Pt s/p lap appy on 04/28/13. Pt called to inform us that she has been having nausea since surgery. She has been eating and able to keep foods done but she is just very nauseous afterwards. Offered pt protocol for Zofran or Phenergan, pt states that she has been taking some of her mothers phenergan and this has not been helping any. Please advise.

## 2013-05-13 ENCOUNTER — Encounter (INDEPENDENT_AMBULATORY_CARE_PROVIDER_SITE_OTHER): Payer: Self-pay

## 2013-05-13 ENCOUNTER — Telehealth (INDEPENDENT_AMBULATORY_CARE_PROVIDER_SITE_OTHER): Payer: Self-pay

## 2013-05-13 NOTE — Telephone Encounter (Signed)
Pt went and had labs drawn 05/12/13. Pt was calling back to see if results were back and to see if she needed to have the CT scan. Spoke with Dr Donell BeersByerly and gave her results. Dr Donell BeersByerly states that she doesn't need to have a CT scan at this time. Pt states that she is still having some nausea and she has been taking her mothers meds. Informed pt is she gets any worse to give us a call back. Pt verbalized understanding.

## 2013-05-14 NOTE — Telephone Encounter (Signed)
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  Informed her that Dr. Donell BeersByerly is unavailable today for us to get permission to do this.  Informed her to give us a call back tomorrow to see what we could do for her.

## 2013-05-20 ENCOUNTER — Ambulatory Visit (INDEPENDENT_AMBULATORY_CARE_PROVIDER_SITE_OTHER): Payer: BC Managed Care – PPO | Admitting: General Surgery

## 2013-05-20 ENCOUNTER — Encounter (INDEPENDENT_AMBULATORY_CARE_PROVIDER_SITE_OTHER): Payer: Self-pay | Admitting: General Surgery

## 2013-05-20 VITALS — BP 126/80 | HR 54 | Temp 97.8°F | Ht 64.0 in | Wt 211.0 lb

## 2013-05-20 DIAGNOSIS — K3580 Unspecified acute appendicitis: Secondary | ICD-10-CM

## 2013-05-20 MED ORDER — ONDANSETRON HCL 4 MG PO TABS: 4.0000 mg | ORAL_TABLET | Freq: Four times a day (QID) | ORAL | Status: AC | PRN

## 2013-05-20 NOTE — Patient Instructions (Signed)
Call in one week to let us know if your nausea is better.

## 2013-05-20 NOTE — Progress Notes (Signed)
Tawni MillersATASHA Sheckler 05/30/1985 [id number] 05/20/2013   History of Present Illness: Tawni Millersatasha Canales is a  28 y.o. female who presents today status post lap appy by Dr. Almond Lintfaera Byerly.  Pathology reveals acute suppurative appendicitis.  The patient is having normal bowel movements, has good pain control.  She  is back to most normal activities. She has been having constant nausea since her surgery.  She is not taking any meds that would cause nausea.  No fevers, chills, or pain.  She had labs done one week ago that were all normal.  Physical Exam: Abd: soft, nontender, active bowel sounds, nondistended.  All incisions are well healed.  Impression: 1.  Acute appendicitis, s/p lap appy  Plan: She  is able to return to normal activities. She has been given multiple options. A) anti-emetics B) Ct scan C) GI referral.  The patient has decided that she would like to start out with anti-emetics.  If this fails to help, then she will call back and we can discuss CT scan vs referral to GI for chronic nausea.  I have a low suspicion for an abscess at this point based off of clinical and objective findings.  She will follow up as needed.

## 2013-06-18 ENCOUNTER — Encounter (INDEPENDENT_AMBULATORY_CARE_PROVIDER_SITE_OTHER): Payer: Self-pay

## 2013-10-07 ENCOUNTER — Emergency Department (HOSPITAL_BASED_OUTPATIENT_CLINIC_OR_DEPARTMENT_OTHER): Payer: BC Managed Care – PPO

## 2013-10-07 ENCOUNTER — Encounter (HOSPITAL_BASED_OUTPATIENT_CLINIC_OR_DEPARTMENT_OTHER): Payer: Self-pay | Admitting: Emergency Medicine

## 2013-10-07 ENCOUNTER — Emergency Department (HOSPITAL_BASED_OUTPATIENT_CLINIC_OR_DEPARTMENT_OTHER)
Admission: EM | Admit: 2013-10-07 | Discharge: 2013-10-07 | Disposition: A | Discharge status: Home / Self Care | Payer: BC Managed Care – PPO | Attending: Emergency Medicine | Admitting: Emergency Medicine

## 2013-10-07 DIAGNOSIS — R079 Chest pain, unspecified: Secondary | ICD-10-CM | POA: Diagnosis present

## 2013-10-07 DIAGNOSIS — M940 Chondrocostal junction syndrome [Tietze]: Secondary | ICD-10-CM | POA: Insufficient documentation

## 2013-10-07 DIAGNOSIS — Z79899 Other long term (current) drug therapy: Secondary | ICD-10-CM | POA: Insufficient documentation

## 2013-10-07 DIAGNOSIS — G43909 Migraine, unspecified, not intractable, without status migrainosus: Secondary | ICD-10-CM | POA: Insufficient documentation

## 2013-10-07 DIAGNOSIS — Z862 Personal history of diseases of the blood and blood-forming organs and certain disorders involving the immune mechanism: Secondary | ICD-10-CM | POA: Diagnosis not present

## 2013-10-07 MED ORDER — KETOROLAC TROMETHAMINE 60 MG/2ML IM SOLN
60.0000 mg | Freq: Once | INTRAMUSCULAR | Status: AC
  Administered 2013-10-07: 60 mg via INTRAMUSCULAR
  Filled 2013-10-07: qty 2

## 2013-10-07 NOTE — ED Notes (Signed)
Developed central chest pain radiating under left breast at 0800 yesterday while at work.  CP has been intermittent, kept her up all night and unable to rest.  Took Ibuprofen yesterday without relief.

## 2013-10-07 NOTE — Discharge Instructions (Signed)
Costochondritis Costochondritis, sometimes called Tietze syndrome, is a swelling and irritation (inflammation) of the tissue (cartilage) that connects your ribs with your breastbone (sternum). It causes pain in the chest and rib area. Costochondritis usually goes away on its own over time. It can take up to 6 weeks or longer to get better, especially if you are unable to limit your activities. CAUSES  Some cases of costochondritis have no known cause. Possible causes include:  Injury (trauma).  Exercise or activity such as lifting.  Severe coughing. SIGNS AND SYMPTOMS  Pain and tenderness in the chest and rib area.  Pain that gets worse when coughing or taking deep breaths.  Pain that gets worse with specific movements. DIAGNOSIS  Your health care provider will do a physical exam and ask about your symptoms. Chest X-rays or other tests may be done to rule out other problems. TREATMENT  Costochondritis usually goes away on its own over time. Your health care provider may prescribe medicine to help relieve pain. HOME CARE INSTRUCTIONS   Avoid exhausting physical activity. Try not to strain your ribs during normal activity. This would include any activities using chest, abdominal, and side muscles, especially if heavy weights are used.  Apply ice to the affected area for the first 2 days after the pain begins.  Put ice in a plastic bag.  Place a towel between your skin and the bag.  Leave the ice on for 20 minutes, 2-3 times a day.  Only take over-the-counter or prescription medicines as directed by your health care provider. SEEK MEDICAL CARE IF:  You have redness or swelling at the rib joints. These are signs of infection.  Your pain does not go away despite rest or medicine. SEEK IMMEDIATE MEDICAL CARE IF:   Your pain increases or you are very uncomfortable.  You have shortness of breath or difficulty breathing.  You cough up blood.  You have worse chest pains,  sweating, or vomiting.  You have a fever or persistent symptoms for more than 2-3 days.  You have a fever and your symptoms suddenly get worse. MAKE SURE YOU:   Understand these instructions.  Will watch your condition.  Will get help right away if you are not doing well or get worse. Document Released: 10/05/2004 Document Revised: 10/16/2012 Document Reviewed: 07/30/2012 Exit[employer] Patient Information 2015 [employer]. This information is not intended to replace advice given to you by your health care provider. Make sure you discuss any questions you have with your health care provider.  

## 2013-10-07 NOTE — ED Provider Notes (Signed)
CSN: [id number]     Arrival date & time 10/07/13  [id number] History   First MD Initiated Contact with Patient 10/07/13 1010     Chief Complaint  Patient presents with  . Chest Pain     (Consider location/radiation/quality/duration/timing/severity/associated sxs/prior Treatment) HPI Comments: PT presents with chest pain.  Pain started yesterday at work.  Describes as aching pain to center of chest radiating under left breast.  Non exertional, non-pleuritic.  No SOB.  No n/v.  No recent cough or chest congestion.  No fevers.  No hx of heart problems.  No leg pain or swelling.  No oral contraceptive use.  Pt took ibuprofen last night without relief.  Has been constant and unchanged since last night.  Patient is a 28 y.o. female presenting with chest pain.  Chest Pain Associated symptoms: no abdominal pain, no back pain, no cough, no diaphoresis, no dizziness, no fatigue, no fever, no headache, no nausea, no numbness, no shortness of breath, not vomiting and no weakness     Past Medical History  Diagnosis Date  . Migraines   . Sciatica   . Anemia    Past Surgical History  Procedure Laterality Date  . Cesarean section  06/09/2011  . Tonsillectomy    . Laparoscopic appendectomy N/A 04/28/2013    Procedure: APPENDECTOMY LAPAROSCOPIC;  Surgeon: Almond LintFaera Byerly, MD;  Location: [facility];  Service: General;  Laterality: N/A;   No family history on file. History  Substance Use Topics  . Smoking status: Never Smoker   . Smokeless tobacco: Never Used  . Alcohol Use: No   OB History   Grav Para Term Preterm Abortions TAB SAB Ect Mult Living   1 1             Review of Systems  Constitutional: Negative for fever, chills, diaphoresis and fatigue.  HENT: Negative for congestion, rhinorrhea and sneezing.   Eyes: Negative.   Respiratory: Negative for cough, chest tightness and shortness of breath.   Cardiovascular: Positive for chest pain. Negative for leg swelling.  Gastrointestinal: Negative for  nausea, vomiting, abdominal pain, diarrhea and blood in stool.  Genitourinary: Negative for frequency, hematuria, flank pain and difficulty urinating.  Musculoskeletal: Negative for arthralgias and back pain.  Skin: Negative for rash.  Neurological: Negative for dizziness, speech difficulty, weakness, numbness and headaches.      Allergies  Review of patient's allergies indicates no known allergies.  Home Medications   Prior to Admission medications   Medication Sig Start Date End Date Taking? Authorizing Provider  etonogestrel (NEXPLANON) 68 MG IMPL implant Inject 1 each into the skin once.    Historical Provider, MD  ibuprofen (ADVIL,MOTRIN) 800 MG tablet Take 800 mg by mouth every 8 (eight) hours as needed (pain).     Historical Provider, MD  ondansetron (ZOFRAN) 4 MG tablet Take 1 tablet (4 mg total) by mouth every 6 (six) hours as needed for nausea or vomiting. 05/20/13   Barnetta ChapelKelly Osborne, PA-C  phentermine 37.5 MG capsule Take 37.5 mg by mouth every morning.    Historical Provider, MD  polyethylene glycol (MIRALAX / GLYCOLAX) packet Take 17 g by mouth daily as needed. constipation     Historical Provider, MD  topiramate (TOPAMAX) 100 MG tablet Take 100 mg by mouth daily.    Historical Provider, MD  traMADol (ULTRAM) 50 MG tablet Take by mouth every 6 (six) hours as needed.    Historical Provider, MD   BP 124/83  Pulse 85  Temp(Src) 98.5  F (36.9 C) (Oral)  Resp 16  Ht 5\' 4"  (1.626 m)  Wt 215 lb (97.523 kg)  BMI 36.89 kg/m2  SpO2 100% Physical Exam  Constitutional: She is oriented to person, place, and time. She appears well-developed and well-nourished.  HENT:  Head: Normocephalic and atraumatic.  Eyes: Pupils are equal, round, and reactive to light.  Neck: Normal range of motion. Neck supple.  Cardiovascular: Normal rate, regular rhythm and normal heart sounds.   Pulmonary/Chest: Effort normal and breath sounds normal. No respiratory distress. She has no wheezes. She has no  rales. She exhibits tenderness (+reproducible pain across left chest wall).  Abdominal: Soft. Bowel sounds are normal. There is no tenderness. There is no rebound and no guarding.  Musculoskeletal: Normal range of motion. She exhibits no edema.  No calf tenderness  Lymphadenopathy:    She has no cervical adenopathy.  Neurological: She is alert and oriented to person, place, and time.  Skin: Skin is warm and dry. No rash noted.  Psychiatric: She has a normal mood and affect.    ED Course  Procedures (including critical care time) Labs Review Labs Reviewed - No data to display  Imaging Review Dg Chest 2 View  10/07/2013   CLINICAL DATA:  Mid anterior chest pain  EXAM: CHEST  2 VIEW  COMPARISON:  Radiograph 03/31/2012  FINDINGS: Normal mediastinum and cardiac silhouette. Normal pulmonary vasculature. No evidence of effusion, infiltrate, or pneumothorax. No acute bony abnormality.  IMPRESSION: No acute cardiopulmonary process.   Electronically Signed   By: Genevive Bi M.D.   On: 10/07/2013 10:06     EKG Interpretation   Date/Time:  Tuesday October 07 2013 09:32:59 EDT Ventricular Rate:  82 PR Interval:  168 QRS Duration: 80 QT Interval:  362 QTC Calculation: 422 R Axis:   33 Text Interpretation:  Normal sinus rhythm Normal ECG Confirmed by Moustapha Tooker   MD, Ashani Pumphrey ([id number]) on 10/07/2013 10:49:01 AM      MDM   Final diagnoses:  Costochondritis    EKG without ischemia or arrhythmia.  No evidence of pneumonia or PTX.  Pain is reproducible and likely costochondritis.  No suggestions of PE.  Pt given toradol in the ED, given written rx for naprosyn.  Advised to f/u with PMD if symptoms not improved in 2 days.    Rolan Bucco, MD 10/07/13 1050

## 2013-11-10 ENCOUNTER — Encounter (HOSPITAL_BASED_OUTPATIENT_CLINIC_OR_DEPARTMENT_OTHER): Payer: Self-pay | Admitting: Emergency Medicine

## 2014-01-07 ENCOUNTER — Ambulatory Visit (INDEPENDENT_AMBULATORY_CARE_PROVIDER_SITE_OTHER): Payer: Self-pay

## 2014-01-07 ENCOUNTER — Other Ambulatory Visit: Payer: Self-pay | Admitting: Adult Health

## 2014-01-07 DIAGNOSIS — M545 Low back pain: Secondary | ICD-10-CM

## 2014-01-07 DIAGNOSIS — W19XXXA Unspecified fall, initial encounter: Secondary | ICD-10-CM

## 2014-01-07 DIAGNOSIS — M438X2 Other specified deforming dorsopathies, cervical region: Secondary | ICD-10-CM

## 2014-01-07 DIAGNOSIS — M419 Scoliosis, unspecified: Secondary | ICD-10-CM

## 2014-10-29 ENCOUNTER — Emergency Department (HOSPITAL_BASED_OUTPATIENT_CLINIC_OR_DEPARTMENT_OTHER): Payer: No Typology Code available for payment source

## 2014-10-29 ENCOUNTER — Emergency Department (HOSPITAL_BASED_OUTPATIENT_CLINIC_OR_DEPARTMENT_OTHER)
Admission: EM | Admit: 2014-10-29 | Discharge: 2014-10-29 | Disposition: A | Discharge status: Home / Self Care | Payer: No Typology Code available for payment source | Attending: Emergency Medicine | Admitting: Emergency Medicine

## 2014-10-29 ENCOUNTER — Encounter (HOSPITAL_BASED_OUTPATIENT_CLINIC_OR_DEPARTMENT_OTHER): Payer: Self-pay | Admitting: *Deleted

## 2014-10-29 DIAGNOSIS — M25512 Pain in left shoulder: Secondary | ICD-10-CM | POA: Insufficient documentation

## 2014-10-29 DIAGNOSIS — R110 Nausea: Secondary | ICD-10-CM | POA: Diagnosis not present

## 2014-10-29 DIAGNOSIS — Z79899 Other long term (current) drug therapy: Secondary | ICD-10-CM | POA: Diagnosis not present

## 2014-10-29 DIAGNOSIS — Z862 Personal history of diseases of the blood and blood-forming organs and certain disorders involving the immune mechanism: Secondary | ICD-10-CM | POA: Insufficient documentation

## 2014-10-29 DIAGNOSIS — R0602 Shortness of breath: Secondary | ICD-10-CM | POA: Insufficient documentation

## 2014-10-29 DIAGNOSIS — R079 Chest pain, unspecified: Secondary | ICD-10-CM | POA: Insufficient documentation

## 2014-10-29 DIAGNOSIS — G43909 Migraine, unspecified, not intractable, without status migrainosus: Secondary | ICD-10-CM | POA: Insufficient documentation

## 2014-10-29 LAB — CBC
HCT: 37.9 % (ref 36.0–46.0)
HEMOGLOBIN: 12.6 g/dL (ref 12.0–15.0)
MCH: 27.6 pg (ref 26.0–34.0)
MCHC: 33.2 g/dL (ref 30.0–36.0)
MCV: 83.1 fL (ref 78.0–100.0)
Platelets: 319 10*3/uL (ref 150–400)
RBC: 4.56 MIL/uL (ref 3.87–5.11)
RDW: 12.1 % (ref 11.5–15.5)
WBC: 5.9 10*3/uL (ref 4.0–10.5)

## 2014-10-29 LAB — TROPONIN I: Troponin I: <0.03 ng/mL (ref <0.031)

## 2014-10-29 LAB — BASIC METABOLIC PANEL
Anion gap: 5 (ref 5–15)
BUN: 13 mg/dL (ref 6–20)
CO2: 19 mmol/L — ABNORMAL LOW (ref 22–32)
Calcium: 8.9 mg/dL (ref 8.9–10.3)
Chloride: 112 mmol/L — ABNORMAL HIGH (ref 101–111)
Creatinine, Ser: 0.84 mg/dL (ref 0.44–1.00)
GFR calc Af Amer: >60 mL/min (ref >60)
GFR calc non Af Amer: >60 mL/min (ref >60)
GLUCOSE: 112 mg/dL — AB (ref 65–99)
POTASSIUM: 4.1 mmol/L (ref 3.5–5.1)
SODIUM: 136 mmol/L (ref 135–145)

## 2014-10-29 MED ORDER — KETOROLAC TROMETHAMINE 60 MG/2ML IM SOLN
60.0000 mg | Freq: Once | INTRAMUSCULAR | Status: AC
  Administered 2014-10-29: 60 mg via INTRAMUSCULAR
  Filled 2014-10-29: qty 2

## 2014-10-29 MED ORDER — TRAMADOL HCL 50 MG PO TABS: 50.0000 mg | ORAL_TABLET | Freq: Four times a day (QID) | ORAL | Status: DC | PRN

## 2014-10-29 NOTE — Discharge Instructions (Signed)
Ibuprofen 600 mg 3 times daily for the next several days.  Tramadol as prescribed as needed for pain not relieved with ibuprofen.  Follow up with your primary Dr. next week if not improving, and return to the ER if symptoms significantly worsen or change.   Chest Wall Pain Chest wall pain is pain in or around the bones and muscles of your chest. Sometimes, an injury causes this pain. Sometimes, the cause may not be known. This pain may take several weeks or longer to get better. HOME CARE INSTRUCTIONS  Pay attention to any changes in your symptoms. Take these actions to help with your pain:   Rest as told by your health care provider.   Avoid activities that cause pain. These include any activities that use your chest muscles or your abdominal and side muscles to lift heavy items.   If directed, apply ice to the painful area:  Put ice in a plastic bag.  Place a towel between your skin and the bag.  Leave the ice on for 20 minutes, 2-3 times per day.  Take over-the-counter and prescription medicines only as told by your health care provider.  Do not use tobacco products, including cigarettes, chewing tobacco, and e-cigarettes. If you need help quitting, ask your health care provider.  Keep all follow-up visits as told by your health care provider. This is important. SEEK MEDICAL CARE IF:  You have a fever.  Your chest pain becomes worse.  You have new symptoms. SEEK IMMEDIATE MEDICAL CARE IF:  You have nausea or vomiting.  You feel sweaty or light-headed.  You have a cough with phlegm (sputum) or you cough up blood.  You develop shortness of breath.   This information is not intended to replace advice given to you by your health care provider. Make sure you discuss any questions you have with your health care provider.   Document Released: 12/26/2004 Document Revised: 09/16/2014 Document Reviewed: 03/23/2014 Elsevier Interactive Patient Education [employer]2016 [employer]

## 2014-10-29 NOTE — ED Notes (Signed)
Sudden onset of sharp pain in her chest since yesterday. Today she has felt SOB with exertion. Tonight pain started going down her left arm. Weakness in her arm.

## 2014-10-29 NOTE — ED Provider Notes (Signed)
CSN: [id number]     Arrival date & time 10/29/14  2019 History  By signing my name below, I, Gwenyth Ober, attest that this documentation has been prepared under the direction and in the presence of Geoffery Lyons, MD.  Electronically Signed: Gwenyth Ober, ED Scribe. 10/29/2014. 9:00 PM.   Chief Complaint  Patient presents with  . Chest Pain   Patient is a 29 y.o. female presenting with chest pain. The history is provided by the patient. No language interpreter was used.  Chest Pain Pain location:  L chest Pain quality: sharp   Pain radiates to:  L shoulder Pain radiates to the back: no   Pain severity:  Moderate Onset quality:  Gradual Duration:  2 days Timing:  Intermittent Progression:  Worsening Chronicity:  New Relieved by:  None tried Worsened by:  Nothing tried Associated symptoms: nausea and shortness of breath   Associated symptoms: no cough    HPI Comments: Katherine Patel is a 30 y.o. female who presents to the Emergency Department complaining of intermittent, moderate, sharp CP that started yesterday and became more constant today. Pt states left-shoulder pain that started today, SOB and nausea as associated symptoms. She denies aggravating or relieving factors. Pt denies a history of cardiac factors, smoking, spicy foods and recent injuries or falls. She denies regular exercise, but notes that she is able to be active and climb stairs without difficulty. Pt also denies cough as an associated symptom.  Past Medical History  Diagnosis Date  . Migraines   . Sciatica   . Anemia    Past Surgical History  Procedure Laterality Date  . Cesarean section  06/09/2011  . Tonsillectomy    . Laparoscopic appendectomy N/A 04/28/2013    Procedure: APPENDECTOMY LAPAROSCOPIC;  Surgeon: Almond Lint, MD;  Location: [facility];  Service: General;  Laterality: N/A;   No family history on file. Social History  Substance Use Topics  . Smoking status: Never Smoker   . Smokeless  tobacco: Never Used  . Alcohol Use: No   OB History    Gravida Para Term Preterm AB TAB SAB Ectopic Multiple Living   1 1             Review of Systems  Respiratory: Positive for shortness of breath. Negative for cough.   Cardiovascular: Positive for chest pain.  Gastrointestinal: Positive for nausea.  All other systems reviewed and are negative.  Allergies  Review of patient's allergies indicates no known allergies.  Home Medications   Prior to Admission medications   Medication Sig Start Date End Date Taking? Authorizing Provider  verapamil (CALAN) 120 MG tablet Take 120 mg by mouth 3 (three) times daily.   Yes Historical Provider, MD  etonogestrel (NEXPLANON) 68 MG IMPL implant Inject 1 each into the skin once.    Historical Provider, MD  ibuprofen (ADVIL,MOTRIN) 800 MG tablet Take 800 mg by mouth every 8 (eight) hours as needed (pain).     Historical Provider, MD  ondansetron (ZOFRAN) 4 MG tablet Take 1 tablet (4 mg total) by mouth every 6 (six) hours as needed for nausea or vomiting. 05/20/13   Barnetta Chapel, PA-C  phentermine 37.5 MG capsule Take 37.5 mg by mouth every morning.    Historical Provider, MD  polyethylene glycol (MIRALAX / GLYCOLAX) packet Take 17 g by mouth daily as needed. constipation     Historical Provider, MD  topiramate (TOPAMAX) 100 MG tablet Take 100 mg by mouth daily.    Historical Provider, MD  traMADol (ULTRAM) 50 MG tablet Take by mouth every 6 (six) hours as needed.    Historical Provider, MD   BP 121/83 mmHg  Pulse 86  Temp(Src) 98.5 F (36.9 C) (Oral)  Resp 20  Ht 5\' 4"  (1.626 m)  Wt 220 lb (99.791 kg)  BMI 37.74 kg/m2  SpO2 99% Physical Exam  Constitutional: She appears well-developed and well-nourished. No distress.  HENT:  Head: Normocephalic and atraumatic.  Eyes: Conjunctivae and EOM are normal.  Neck: Neck supple. No tracheal deviation present.  Cardiovascular: Normal rate, regular rhythm and normal heart sounds.   Pulmonary/Chest:  Effort normal and breath sounds normal. No respiratory distress.  Abdominal: Soft. There is no tenderness.  Musculoskeletal: She exhibits no edema.  Skin: Skin is warm and dry.  Psychiatric: She has a normal mood and affect. Her behavior is normal.  Nursing note and vitals reviewed.   ED Course  Procedures  DIAGNOSTIC STUDIES: Oxygen Saturation is 99% on RA, normal by my interpretation.    COORDINATION OF CARE: 8:48 PM Discussed treatment plan with pt which includes lab work and a chest x-ray. She agreed to plan.  Labs Review Labs Reviewed  BASIC METABOLIC PANEL - Abnormal; Notable for the following:    Chloride 112 (*)    CO2 19 (*)    Glucose, Bld 112 (*)    All other components within normal limits  CBC  TROPONIN I    Imaging Review Dg Chest 2 View  10/29/2014  CLINICAL DATA:  Left-sided chest pain for 2 days with shortness of Breath EXAM: CHEST - 2 VIEW COMPARISON:  10/07/2013 FINDINGS: The heart size and mediastinal contours are within normal limits. Both lungs are clear. The visualized skeletal structures are unremarkable. IMPRESSION: No active disease. Electronically Signed   By: Alcide CleverMark  Lukens M.D.   On: 10/29/2014 21:33   I have personally reviewed and evaluated these images and lab results as part of my medical decision-making.   EKG Interpretation   Date/Time:  Thursday October 29 2014 20:35:43 EDT Ventricular Rate:  89 PR Interval:  158 QRS Duration: 86 QT Interval:  360 QTC Calculation: 438 R Axis:   -4 Text Interpretation:  Normal sinus rhythm Left ventricular hypertrophy  Abnormal ECG Confirmed by Vernard Gram  MD, Kharma Sampsel ([id number]) on 10/29/2014 9:31:33  PM      MDM   Final diagnoses:  None    Workup reveals normal EKG with the suggestion of LVH. There is no acute change and her workup is unremarkable. Her symptoms are atypical for cardiac pain. I will recommend anti-inflammatory isn't prescribed tramadol to take as needed. If she is to not improve in the  next several days, she should follow-up with her primary doctor.  Roney JaffeI, Shawndrea Rutkowski, personally performed the services described in this documentation. All medical record entries made by the scribe were at my direction and in my presence.  I have reviewed the chart and discharge instructions and agree that the record reflects my personal performance and is accurate and complete. Geoffery LyonseLo, Kaycee Mcgaugh.  10/29/2014. 10:36 PM.       Geoffery Lyonsouglas Jacqueline Spofford, MD 10/29/14 2236

## 2015-08-07 ENCOUNTER — Emergency Department (HOSPITAL_BASED_OUTPATIENT_CLINIC_OR_DEPARTMENT_OTHER)
Admission: EM | Admit: 2015-08-07 | Discharge: 2015-08-07 | Disposition: A | Discharge status: Home / Self Care | Payer: Medicaid Other | Attending: Emergency Medicine | Admitting: Emergency Medicine

## 2015-08-07 ENCOUNTER — Encounter (HOSPITAL_BASED_OUTPATIENT_CLINIC_OR_DEPARTMENT_OTHER): Payer: Self-pay | Admitting: *Deleted

## 2015-08-07 DIAGNOSIS — G43909 Migraine, unspecified, not intractable, without status migrainosus: Secondary | ICD-10-CM | POA: Diagnosis present

## 2015-08-07 DIAGNOSIS — G43009 Migraine without aura, not intractable, without status migrainosus: Secondary | ICD-10-CM

## 2015-08-07 DIAGNOSIS — Z79899 Other long term (current) drug therapy: Secondary | ICD-10-CM | POA: Diagnosis not present

## 2015-08-07 DIAGNOSIS — R110 Nausea: Secondary | ICD-10-CM | POA: Diagnosis not present

## 2015-08-07 DIAGNOSIS — H53149 Visual discomfort, unspecified: Secondary | ICD-10-CM | POA: Insufficient documentation

## 2015-08-07 MED ORDER — DEXAMETHASONE SODIUM PHOSPHATE 10 MG/ML IJ SOLN
10.0000 mg | Freq: Once | INTRAMUSCULAR | Status: AC
  Administered 2015-08-07: 10 mg via INTRAVENOUS
  Filled 2015-08-07: qty 1

## 2015-08-07 MED ORDER — METOCLOPRAMIDE HCL 5 MG/ML IJ SOLN
10.0000 mg | Freq: Once | INTRAMUSCULAR | Status: AC
  Administered 2015-08-07: 10 mg via INTRAVENOUS
  Filled 2015-08-07: qty 2

## 2015-08-07 MED ORDER — DIPHENHYDRAMINE HCL 50 MG/ML IJ SOLN
25.0000 mg | Freq: Once | INTRAMUSCULAR | Status: AC
  Administered 2015-08-07: 25 mg via INTRAVENOUS
  Filled 2015-08-07: qty 1

## 2015-08-07 MED ORDER — SODIUM CHLORIDE 0.9 % IV BOLUS (SEPSIS)
1000.0000 mL | Freq: Once | INTRAVENOUS | Status: AC
  Administered 2015-08-07: 1000 mL via INTRAVENOUS

## 2015-08-07 NOTE — ED Provider Notes (Signed)
[facility] Provider Note   CSN: [id number] Arrival date & time: 08/07/15  1123  First Provider Contact:  None       History   Chief Complaint Chief Complaint  Patient presents with  . Migraine    HPI Katherine Patel is a 30 y.o. female.  Patient is a 30 year old female with a history of migraines. She presents with a migraine type headache. She states it started 4 days ago and it's gradually been worsening since that time. It's bifrontal and throbbing in nature. She denies any neck pain. No fevers. No URI symptoms. No vomiting but she has had some mild nausea. She has some mild photophobia. She states the headache feels similar to her past migraines type headaches. She takes Topamax and verapamil for prevention. She talked to her PCP who prescribed her some Fioricet which she's been taking and this has not been helping her pain. She denies any numbness or weakness to her extremities. No ataxia. No speech deficits. No vision changes.    Migraine  Associated symptoms include headaches. Pertinent negatives include no chest pain, no abdominal pain and no shortness of breath.    Past Medical History:  Diagnosis Date  . Anemia   . Migraines   . Sciatica     Patient Active Problem List   Diagnosis Date Noted  . Appendicitis 04/27/2013    Past Surgical History:  Procedure Laterality Date  . CESAREAN SECTION  06/09/2011  . LAPAROSCOPIC APPENDECTOMY N/A 04/28/2013   Procedure: APPENDECTOMY LAPAROSCOPIC;  Surgeon: Almond Lint, MD;  Location: [facility];  Service: General;  Laterality: N/A;  . TONSILLECTOMY      OB History    Gravida Para Term Preterm AB Living   1 1           SAB TAB Ectopic Multiple Live Births                   Home Medications    Prior to Admission medications   Medication Sig Start Date End Date Taking? Authorizing Provider  butalbital-acetaminophen-caffeine (ESGIC PLUS) 50-500-40 MG tablet Take 1 tablet by mouth every 4 (four) hours as  needed for pain.   Yes Historical Provider, MD  clorazepate (TRANXENE) 7.5 MG tablet Take 7.5 mg by mouth 2 (two) times daily as needed for anxiety.   Yes Historical Provider, MD  cyclobenzaprine (FLEXERIL) 10 MG tablet Take 10 mg by mouth 3 (three) times daily as needed for muscle spasms.   Yes Historical Provider, MD  etonogestrel (NEXPLANON) 68 MG IMPL implant Inject 1 each into the skin once.   Yes Historical Provider, MD  LORazepam (ATIVAN) 0.5 MG tablet Take 0.5 mg by mouth every 8 (eight) hours.   Yes Historical Provider, MD  lubiprostone (AMITIZA) 24 MCG capsule Take 24 mcg by mouth 2 (two) times daily with a meal.   Yes Historical Provider, MD  polyethylene glycol (MIRALAX / GLYCOLAX) packet Take 17 g by mouth daily as needed. constipation    Yes Historical Provider, MD  topiramate (TOPAMAX) 100 MG tablet Take 100 mg by mouth daily.   Yes Historical Provider, MD  verapamil (CALAN) 120 MG tablet Take 120 mg by mouth 3 (three) times daily.   Yes Historical Provider, MD  ondansetron (ZOFRAN) 4 MG tablet Take 1 tablet (4 mg total) by mouth every 6 (six) hours as needed for nausea or vomiting. 05/20/13   Barnetta Chapel, PA-C  traMADol (ULTRAM) 50 MG tablet Take 1 tablet (50 mg total)  by mouth every 6 (six) hours as needed. 10/29/14   Geoffery Lyons, MD    Family History History reviewed. No pertinent family history.  Social History Social History  Substance Use Topics  . Smoking status: Never Smoker  . Smokeless tobacco: Never Used  . Alcohol use No     Allergies   Review of patient's allergies indicates no known allergies.   Review of Systems Review of Systems  Constitutional: Negative for chills, diaphoresis, fatigue and fever.  HENT: Negative for congestion, rhinorrhea and sneezing.   Eyes: Positive for photophobia.  Respiratory: Negative for cough, chest tightness and shortness of breath.   Cardiovascular: Negative for chest pain and leg swelling.  Gastrointestinal: Positive  for nausea. Negative for abdominal pain, blood in stool, diarrhea and vomiting.  Genitourinary: Negative for difficulty urinating, flank pain, frequency and hematuria.  Musculoskeletal: Negative for arthralgias and back pain.  Skin: Negative for rash.  Neurological: Positive for headaches. Negative for dizziness, speech difficulty, weakness and numbness.     Physical Exam Updated Vital Signs BP 109/74   Pulse 80   Temp 98.6 F (37 C) (Oral)   Resp 18   Ht  (1.626 m)   Wt 227 lb (103 kg)   SpO2 100%   BMI 38.96 kg/m   Physical Exam  Constitutional: She is oriented to person, place, and time. She appears well-developed and well-nourished.  HENT:  Head: Normocephalic and atraumatic.  Eyes: Pupils are equal, round, and reactive to light.  Fundi not well visualized  Neck: Normal range of motion. Neck supple.  Cardiovascular: Normal rate, regular rhythm and normal heart sounds.   Pulmonary/Chest: Effort normal and breath sounds normal. No respiratory distress. She has no wheezes. She has no rales. She exhibits no tenderness.  Abdominal: Soft. Bowel sounds are normal. There is no tenderness. There is no rebound and no guarding.  Musculoskeletal: Normal range of motion. She exhibits no edema.  Lymphadenopathy:    She has no cervical adenopathy.  Neurological: She is alert and oriented to person, place, and time. She has normal strength. No cranial nerve deficit or sensory deficit.  FTN intact  Skin: Skin is warm and dry. No rash noted.  Psychiatric: She has a normal mood and affect.     ED Treatments / Results  Labs (all labs ordered are listed, but only abnormal results are displayed) Labs Reviewed - No data to display  EKG  EKG Interpretation None       Radiology No results found.  Procedures Procedures (including critical care time)  Medications Ordered in ED Medications  dexamethasone (DECADRON) injection 10 mg (10 mg Intravenous Given 08/07/15 1249)    diphenhydrAMINE (BENADRYL) injection 25 mg (25 mg Intravenous Given 08/07/15 1249)  metoCLOPramide (REGLAN) injection 10 mg (10 mg Intravenous Given 08/07/15 1248)  sodium chloride 0.9 % bolus 1,000 mL (0 mLs Intravenous Stopped 08/07/15 1430)     Initial Impression / Assessment and Plan / ED Course  I have reviewed the triage vital signs and the nursing notes.  Pertinent labs & imaging results that were available during my care of the patient were reviewed by me and considered in my medical decision making (see chart for details).  Clinical Course    Patient presents with a migraine. She has no symptoms that would be more suggestive of subarachnoid hemorrhage or meningitis. Her headache is consistent with her prior migrainous type headaches. She was given migraine cocktail and is feeling better after this. She is ready  to go home. She was discharged home in good condition. She was advised to follow-up with her PCP if her symptoms are not improving or return here as needed for any worsening symptoms.  Final Clinical Impressions(s) / ED Diagnoses   Final diagnoses:  Nonintractable migraine, unspecified migraine type    New Prescriptions New Prescriptions   No medications on file     Rolan Bucco, MD 08/07/15 1446

## 2015-08-07 NOTE — ED Triage Notes (Signed)
Per pt report ongoing HA for several days, used migraine medication with no change.

## 2015-08-07 NOTE — ED Notes (Signed)
Pt placed on auto vitals Q30.  

## 2016-03-21 ENCOUNTER — Emergency Department (HOSPITAL_BASED_OUTPATIENT_CLINIC_OR_DEPARTMENT_OTHER)
Admission: EM | Admit: 2016-03-21 | Discharge: 2016-03-21 | Disposition: A | Discharge status: Home / Self Care | Payer: Medicaid Other | Attending: Emergency Medicine | Admitting: Emergency Medicine

## 2016-03-21 ENCOUNTER — Encounter (HOSPITAL_BASED_OUTPATIENT_CLINIC_OR_DEPARTMENT_OTHER): Payer: Self-pay | Admitting: *Deleted

## 2016-03-21 ENCOUNTER — Emergency Department (HOSPITAL_BASED_OUTPATIENT_CLINIC_OR_DEPARTMENT_OTHER): Payer: Medicaid Other

## 2016-03-21 DIAGNOSIS — W500XXA Accidental hit or strike by another person, initial encounter: Secondary | ICD-10-CM | POA: Diagnosis not present

## 2016-03-21 DIAGNOSIS — S20219A Contusion of unspecified front wall of thorax, initial encounter: Secondary | ICD-10-CM

## 2016-03-21 DIAGNOSIS — Z79899 Other long term (current) drug therapy: Secondary | ICD-10-CM | POA: Diagnosis not present

## 2016-03-21 DIAGNOSIS — I10 Essential (primary) hypertension: Secondary | ICD-10-CM | POA: Diagnosis not present

## 2016-03-21 DIAGNOSIS — S299XXA Unspecified injury of thorax, initial encounter: Secondary | ICD-10-CM | POA: Diagnosis present

## 2016-03-21 DIAGNOSIS — Y939 Activity, unspecified: Secondary | ICD-10-CM | POA: Diagnosis not present

## 2016-03-21 DIAGNOSIS — Y999 Unspecified external cause status: Secondary | ICD-10-CM | POA: Diagnosis not present

## 2016-03-21 DIAGNOSIS — Y929 Unspecified place or not applicable: Secondary | ICD-10-CM | POA: Insufficient documentation

## 2016-03-21 DIAGNOSIS — S20212A Contusion of left front wall of thorax, initial encounter: Secondary | ICD-10-CM | POA: Insufficient documentation

## 2016-03-21 DIAGNOSIS — R0789 Other chest pain: Secondary | ICD-10-CM

## 2016-03-21 LAB — COMPREHENSIVE METABOLIC PANEL
ALK PHOS: 75 U/L (ref 38–126)
ALT: 12 U/L — ABNORMAL LOW (ref 14–54)
ANION GAP: 8 (ref 5–15)
AST: 20 U/L (ref 15–41)
Albumin: 3.8 g/dL (ref 3.5–5.0)
BILIRUBIN TOTAL: 0.8 mg/dL (ref 0.3–1.2)
BUN: 9 mg/dL (ref 6–20)
CO2: 23 mmol/L (ref 22–32)
Calcium: 9.1 mg/dL (ref 8.9–10.3)
Chloride: 107 mmol/L (ref 101–111)
Creatinine, Ser: 0.72 mg/dL (ref 0.44–1.00)
Glucose, Bld: 91 mg/dL (ref 65–99)
Potassium: 4.2 mmol/L (ref 3.5–5.1)
SODIUM: 138 mmol/L (ref 135–145)
TOTAL PROTEIN: 7.4 g/dL (ref 6.5–8.1)

## 2016-03-21 LAB — CBC
HEMATOCRIT: 37.5 % (ref 36.0–46.0)
Hemoglobin: 12.2 g/dL (ref 12.0–15.0)
MCH: 28.4 pg (ref 26.0–34.0)
MCHC: 32.5 g/dL (ref 30.0–36.0)
MCV: 87.4 fL (ref 78.0–100.0)
Platelets: 208 10*3/uL (ref 150–400)
RBC: 4.29 MIL/uL (ref 3.87–5.11)
RDW: 12.1 % (ref 11.5–15.5)
WBC: 7.9 10*3/uL (ref 4.0–10.5)

## 2016-03-21 LAB — TROPONIN I: Troponin I: <0.03 ng/mL (ref <0.03)

## 2016-03-21 MED ORDER — KETOROLAC TROMETHAMINE 30 MG/ML IJ SOLN
30.0000 mg | Freq: Once | INTRAMUSCULAR | Status: AC
Start: 2016-03-21 — End: 2016-03-21
  Administered 2016-03-21: 30 mg via INTRAVENOUS
  Filled 2016-03-21: qty 1

## 2016-03-21 MED ORDER — IBUPROFEN 600 MG PO TABS: 600.0000 mg | ORAL_TABLET | Freq: Four times a day (QID) | ORAL | 0 refills | Status: DC | PRN

## 2016-03-21 NOTE — ED Notes (Signed)
Unable to establish IV access. MD aware. Medications given IM per MD order
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
## 2016-03-21 NOTE — ED Notes (Signed)
Patient transported to X-ray 

## 2016-05-22 ENCOUNTER — Encounter (HOSPITAL_BASED_OUTPATIENT_CLINIC_OR_DEPARTMENT_OTHER): Payer: Self-pay | Admitting: Adult Health

## 2016-05-22 ENCOUNTER — Emergency Department (HOSPITAL_BASED_OUTPATIENT_CLINIC_OR_DEPARTMENT_OTHER)
Admission: EM | Admit: 2016-05-22 | Discharge: 2016-05-22 | Disposition: A | Discharge status: Home / Self Care | Payer: Medicaid Other | Attending: Emergency Medicine | Admitting: Emergency Medicine

## 2016-05-22 DIAGNOSIS — M25512 Pain in left shoulder: Secondary | ICD-10-CM | POA: Diagnosis present

## 2016-05-22 MED ORDER — METHOCARBAMOL 500 MG PO TABS: 500.0000 mg | ORAL_TABLET | Freq: Three times a day (TID) | ORAL | 0 refills | Status: DC | PRN

## 2016-05-22 MED ORDER — PREDNISONE 20 MG PO TABS
40.0000 mg | ORAL_TABLET | Freq: Once | ORAL | Status: AC
  Administered 2016-05-22: 40 mg via ORAL
  Filled 2016-05-22: qty 2

## 2016-05-22 MED ORDER — PREDNISONE 20 MG PO TABS: 40.0000 mg | ORAL_TABLET | Freq: Every day | ORAL | 0 refills | Status: DC

## 2016-05-22 NOTE — ED Triage Notes (Signed)
Presents with 4-5 days of left arm pain and numbness at times that goes from the back of the left neck down into hands. The pain is not relieved by any medications. Nothing makes it better, nothing makes it worse. Denies nasuea, dizziness, SOB and chest pain. Denies weakness in arm and loss of grip.

## 2016-05-22 NOTE — ED Provider Notes (Signed)
[facility]   CSN: [id number] Arrival date & time: 05/22/16  1737  By signing my name below, I, Linna DarnerRussell Turner, attest that this documentation has been prepared under the direction and in the presence of physician practitioner, Benjiman CorePickering, Kayde Atkerson, MD. Electronically Signed: Linna Darnerussell Turner, Scribe. 05/22/2016. 6:08 PM.  History   Chief Complaint Chief Complaint  Patient presents with  . Arm Pain   The history is provided by the patient. No language interpreter was used.   HPI Comments: Katherine Patel is a right-hand dominant 31 y.o. female who presents to the Emergency Department complaining of constant left shoulder pain that intermittently radiates distally beginning four days ago. She reports some associated intermittent tingling in her left hand. Patient states her shoulder pain is worse when she turns her head to the left. She has tried ibuprofen, heat therapy, IcyHot, and hydrocodone without any improvement of her pain. No recent unusual activity or falls. No known causal factors. No h/o prior shoulder or neck problems. No h/o immunocompromised conditions. NKDA. Patient denies the possibility of pregnancy. She denies focal weakness or any other associated symptoms.  Past Medical History:  Diagnosis Date  . Anemia   . Migraines   . Sciatica     Patient Active Problem List   Diagnosis Date Noted  . Appendicitis 04/27/2013    Past Surgical History:  Procedure Laterality Date  . CESAREAN SECTION  06/09/2011  . LAPAROSCOPIC APPENDECTOMY N/A 04/28/2013   Procedure: APPENDECTOMY LAPAROSCOPIC;  Surgeon: Almond LintFaera Byerly, MD;  Location: [facility];  Service: General;  Laterality: N/A;  . TONSILLECTOMY      OB History    Gravida Para Term Preterm AB Living   1 1           SAB TAB Ectopic Multiple Live Births                   Home Medications    Prior to Admission medications   Medication Sig Start Date End Date Taking? Authorizing Provider    acetaZOLAMIDE (DIAMOX) 125 MG tablet Take 125 mg by mouth 3 (three) times daily.    [provider]  butalbital-acetaminophen-caffeine (ESGIC PLUS) 50-500-40 MG tablet Take 1 tablet by mouth every 4 (four) hours as needed for pain.    [provider]  clorazepate (TRANXENE) 7.5 MG tablet Take 7.5 mg by mouth 2 (two) times daily as needed for anxiety.    [provider]  cyclobenzaprine (FLEXERIL) 10 MG tablet Take 10 mg by mouth 3 (three) times daily as needed for muscle spasms.    [provider]  divalproex (DEPAKOTE) 500 MG DR tablet Take 500 mg by mouth 3 (three) times daily.    [provider]  etonogestrel (NEXPLANON) 68 MG IMPL implant Inject 1 each into the skin once.    [provider]  ibuprofen (ADVIL,MOTRIN) 600 MG tablet Take 1 tablet (600 mg total) by mouth every 6 (six) hours as needed. 03/21/16   Loren RacerYelverton, David, MD  LORazepam (ATIVAN) 0.5 MG tablet Take 0.5 mg by mouth every 8 (eight) hours.    [provider]  lubiprostone (AMITIZA) 24 MCG capsule Take 24 mcg by mouth 2 (two) times daily with a meal.    [provider]  methocarbamol (ROBAXIN) 500 MG tablet Take 1 tablet (500 mg total) by mouth every 8 (eight) hours as needed for muscle spasms. 05/22/16   Benjiman CorePickering, Jeremie Giangrande, MD  ondansetron (ZOFRAN) 4 MG tablet Take 1 tablet (4 mg  total) by mouth every 6 (six) hours as needed for nausea or vomiting. 05/20/13   Barnetta Chapel, PA-C  phentermine 37.5 MG capsule Take 37.5 mg by mouth every morning.    [provider]  polyethylene glycol (MIRALAX / GLYCOLAX) packet Take 17 g by mouth daily as needed. constipation     [provider]  predniSONE (DELTASONE) 20 MG tablet Take 2 tablets (40 mg total) by mouth daily. 05/22/16   Benjiman Core, MD  topiramate (TOPAMAX) 100 MG tablet Take 100 mg by mouth daily.    [provider]  traMADol (ULTRAM) 50 MG tablet Take 1 tablet (50 mg total) by  mouth every 6 (six) hours as needed. 10/29/14   Geoffery Lyons, MD  verapamil (CALAN) 120 MG tablet Take 120 mg by mouth 3 (three) times daily.    [provider]    Family History History reviewed. No pertinent family history.  Social History Social History  Substance Use Topics  . Smoking status: Never Smoker  . Smokeless tobacco: Never Used  . Alcohol use No     Allergies   Patient has no known allergies.   Review of Systems Review of Systems  Musculoskeletal: Positive for myalgias.  Allergic/Immunologic: Negative for immunocompromised state.  Neurological: Positive for numbness. Negative for weakness.   Physical Exam Updated Vital Signs BP 124/89   Pulse 84   Temp 98 F (36.7 C) (Oral)   Resp 18   Ht 5\' 4"  (1.626 m)   Wt 236 lb (107 kg)   SpO2 100%   BMI 40.51 kg/m   Physical Exam  Constitutional: She is oriented to person, place, and time. She appears well-developed and well-nourished. No distress.  HENT:  Head: Normocephalic and atraumatic.  Eyes: Conjunctivae and EOM are normal.  Neck: Neck supple. No tracheal deviation present.  Cardiovascular: Normal rate.   Pulses:      Radial pulses are 2+ on the right side, and 2+ on the left side.  Pulmonary/Chest: Effort normal. No respiratory distress.  Musculoskeletal: Normal range of motion. She exhibits tenderness.  Tenderness over left trapezius. No midline cervical tenderness. Strong radial pulses bilaterally. Strong flexion and extension at elbows and wrists. Good abduction at shoulders.  Neurological: She is alert and oriented to person, place, and time.  Radial, median, and ulnar distribution and sensation is intact to both hands. Radial, median, and ulnar strength is intact to both hands.  Skin: Skin is warm and dry.  Psychiatric: She has a normal mood and affect. Her behavior is normal.  Nursing note and vitals reviewed.  ED Treatments / Results  Labs (all labs ordered are listed, but only  abnormal results are displayed) Labs Reviewed - No data to display  EKG  EKG Interpretation None       Radiology No results found.  Procedures Procedures (including critical care time)  DIAGNOSTIC STUDIES: Oxygen Saturation is 100% on RA, normal by my interpretation.    COORDINATION OF CARE: 6:05 PM Discussed treatment plan with pt at bedside and pt agreed to plan.  Medications Ordered in ED Medications  predniSONE (DELTASONE) tablet 40 mg (40 mg Oral Given 05/22/16 1813)     Initial Impression / Assessment and Plan / ED Course  I have reviewed the triage vital signs and the nursing notes.  Pertinent labs & imaging results that were available during my care of the patient were reviewed by me and considered in my medical decision making (see chart for details).  Patient with left shoulder pain/trapezius pain. Reproducible. Nonfocal exam. Likely muscle skeletal. No trauma. Will discharge home  Final Clinical Impressions(s) / ED Diagnoses   Final diagnoses:  Acute pain of left shoulder    New Prescriptions Discharge Medication List as of 05/22/2016  6:12 PM    START taking these medications   Details  methocarbamol (ROBAXIN) 500 MG tablet Take 1 tablet (500 mg total) by mouth every 8 (eight) hours as needed for muscle spasms., Starting Mon 05/22/2016, Print    predniSONE (DELTASONE) 20 MG tablet Take 2 tablets (40 mg total) by mouth daily., Starting Mon 05/22/2016, Print       I personally performed the services described in this documentation, which was scribed in my presence. The recorded information has been reviewed and is accurate.      Benjiman Core, MD 05/22/16 [phone]

## 2016-12-13 ENCOUNTER — Encounter (HOSPITAL_BASED_OUTPATIENT_CLINIC_OR_DEPARTMENT_OTHER): Payer: Self-pay | Admitting: Emergency Medicine

## 2016-12-13 ENCOUNTER — Other Ambulatory Visit: Payer: Self-pay

## 2016-12-13 ENCOUNTER — Emergency Department (HOSPITAL_BASED_OUTPATIENT_CLINIC_OR_DEPARTMENT_OTHER)
Admission: EM | Admit: 2016-12-13 | Discharge: 2016-12-13 | Disposition: A | Discharge status: Home / Self Care | Payer: Medicaid Other | Attending: Emergency Medicine | Admitting: Emergency Medicine

## 2016-12-13 ENCOUNTER — Emergency Department (HOSPITAL_BASED_OUTPATIENT_CLINIC_OR_DEPARTMENT_OTHER): Payer: Medicaid Other

## 2016-12-13 DIAGNOSIS — R0789 Other chest pain: Secondary | ICD-10-CM | POA: Insufficient documentation

## 2016-12-13 DIAGNOSIS — Z79899 Other long term (current) drug therapy: Secondary | ICD-10-CM | POA: Insufficient documentation

## 2016-12-13 DIAGNOSIS — R079 Chest pain, unspecified: Secondary | ICD-10-CM

## 2016-12-13 HISTORY — DX: Major depressive disorder, single episode, unspecified: F32.9

## 2016-12-13 HISTORY — DX: Anxiety disorder, unspecified: F41.9

## 2016-12-13 HISTORY — DX: Depression, unspecified: F32.A

## 2016-12-13 LAB — BASIC METABOLIC PANEL
Anion gap: 7 (ref 5–15)
BUN: 11 mg/dL (ref 6–20)
CHLORIDE: 110 mmol/L (ref 101–111)
CO2: 22 mmol/L (ref 22–32)
CREATININE: 0.54 mg/dL (ref 0.44–1.00)
Calcium: 8.7 mg/dL — ABNORMAL LOW (ref 8.9–10.3)
GFR calc non Af Amer: >60 mL/min (ref >60)
GLUCOSE: 105 mg/dL — AB (ref 65–99)
Potassium: 4.3 mmol/L (ref 3.5–5.1)
Sodium: 139 mmol/L (ref 135–145)

## 2016-12-13 LAB — TROPONIN I: Troponin I: <0.03 ng/mL (ref <0.03)

## 2016-12-13 LAB — CBC
HCT: 36.8 % (ref 36.0–46.0)
Hemoglobin: 11.8 g/dL — ABNORMAL LOW (ref 12.0–15.0)
MCH: 27.4 pg (ref 26.0–34.0)
MCHC: 32.1 g/dL (ref 30.0–36.0)
MCV: 85.4 fL (ref 78.0–100.0)
PLATELETS: 225 10*3/uL (ref 150–400)
RBC: 4.31 MIL/uL (ref 3.87–5.11)
RDW: 12.2 % (ref 11.5–15.5)
WBC: 5.8 10*3/uL (ref 4.0–10.5)

## 2016-12-13 LAB — PREGNANCY, URINE: Preg Test, Ur: NEGATIVE

## 2016-12-13 MED ORDER — KETOROLAC TROMETHAMINE 30 MG/ML IJ SOLN
30.0000 mg | Freq: Once | INTRAMUSCULAR | Status: AC
  Administered 2016-12-13: 30 mg via INTRAMUSCULAR
  Filled 2016-12-13: qty 1

## 2016-12-13 MED ORDER — ACETAMINOPHEN 325 MG PO TABS
650.0000 mg | ORAL_TABLET | Freq: Once | ORAL | Status: AC
  Administered 2016-12-13: 650 mg via ORAL
  Filled 2016-12-13: qty 2

## 2016-12-13 MED ORDER — IBUPROFEN 600 MG PO TABS: 600.0000 mg | ORAL_TABLET | Freq: Four times a day (QID) | ORAL | 0 refills | Status: DC | PRN

## 2016-12-13 MED FILL — IBUPROFEN 600 MG TABLET: 600 | 7 days supply | Qty: 30 | Fill #0

## 2016-12-13 NOTE — ED Notes (Signed)
ED Provider at bedside. 

## 2016-12-13 NOTE — ED Provider Notes (Signed)
[facility] EMERGENCY DEPARTMENT Provider Note   CSN: [id number] Arrival date & time: 12/13/16  [id number]     History   Chief Complaint Chief Complaint  Patient presents with  . Chest Pain    HPI Katherine Patel is a 31 y.o. female.  HPI   Patient is a 31 year old female with a history of headaches (diagnosed pseudotumor cerebri, on verapamil), anxiety, anemia presenting for central to left-sided chest pain for approximately 12-14 hours.  Patient reports when she was leaving work yesterday she noted a intermittent "squeezing" pain in her center chest that did not resolve throughout the night.  Pain is worse with lying flat. Today, patient reports that it is more constant and sharp in nature.  Patient reports she also felt a heaviness in her left arm.  Patient denies any shortness of breath except for when she gets the intermittent pain, cough, diaphoresis, dizziness or lightheadedness, nausea or vomiting.  No recent upper respiratory infection.  Patient denies any unilateral leg swelling, recent immobilization, history of cancer, estrogen use, hemoptysis, shortness of breath or palpitations.  No family history of early MI.  Patient has not done any activities that would cause trauma to the chest, however she did report that she lifted her daughter a few nights ago who is getting heavy.  Patient did take 162 mg of aspirin last night.  Patient has previously seen Dr. Kennon RoundsLuke Peters [facility].  No stress test performed.  Per note review, suspected musculoskeletal etiology at that time in March 2018.  Past Medical History:  Diagnosis Date  . Anemia   . Anxiety   . Depression   . Migraines   . Sciatica     Patient Active Problem List   Diagnosis Date Noted  . Appendicitis 04/27/2013    Past Surgical History:  Procedure Laterality Date  . CESAREAN SECTION  06/09/2011  . LAPAROSCOPIC APPENDECTOMY N/A 04/28/2013   Procedure: APPENDECTOMY LAPAROSCOPIC;   Surgeon: Almond LintFaera Byerly, MD;  Location: [facility] ORS;  Service: General;  Laterality: N/A;  . TONSILLECTOMY      OB History    Gravida Para Term Preterm AB Living   1 1           SAB TAB Ectopic Multiple Live Births                   Home Medications    Prior to Admission medications   Medication Sig Start Date End Date Taking? Authorizing Provider  acetaZOLAMIDE (DIAMOX) 125 MG tablet Take 125 mg by mouth 3 (three) times daily.   Yes [provider]  citalopram (CELEXA) 20 MG tablet Take 20 mg by mouth daily.   Yes [provider]  clonazePAM (KLONOPIN) 1 MG tablet Take 1 mg by mouth 2 (two) times daily.   Yes [provider]  etonogestrel (NEXPLANON) 68 MG IMPL implant Inject 1 each into the skin once.   Yes [provider]  LORazepam (ATIVAN) 0.5 MG tablet Take 0.5 mg by mouth every 8 (eight) hours.   Yes [provider]  omeprazole (PRILOSEC) 10 MG capsule Take 10 mg by mouth daily.   Yes [provider]  topiramate (TOPAMAX) 100 MG tablet Take 100 mg by mouth daily.   Yes [provider]  verapamil (CALAN) 120 MG tablet Take 120 mg by mouth 3 (three) times daily.   Yes [provider]  butalbital-acetaminophen-caffeine (ESGIC PLUS) 50-500-40 MG tablet Take 1 tablet by mouth every  4 (four) hours as needed for pain.    [provider]  clorazepate (TRANXENE) 7.5 MG tablet Take 7.5 mg by mouth 2 (two) times daily as needed for anxiety.    [provider]  cyclobenzaprine (FLEXERIL) 10 MG tablet Take 10 mg by mouth 3 (three) times daily as needed for muscle spasms.    [provider]  divalproex (DEPAKOTE) 500 MG DR tablet Take 500 mg by mouth 3 (three) times daily.    [provider]  ibuprofen (ADVIL,MOTRIN) 600 MG tablet Take 1 tablet (600 mg total) by mouth every 6 (six) hours as needed. 12/13/16   Aviva KluverMurray, Misao Fackrell B, PA-C  lubiprostone (AMITIZA) 24 MCG capsule Take 24 mcg by mouth 2  (two) times daily with a meal.    [provider]  methocarbamol (ROBAXIN) 500 MG tablet Take 1 tablet (500 mg total) by mouth every 8 (eight) hours as needed for muscle spasms. 05/22/16   Benjiman CorePickering, Nathan, MD  ondansetron (ZOFRAN) 4 MG tablet Take 1 tablet (4 mg total) by mouth every 6 (six) hours as needed for nausea or vomiting. 05/20/13   Barnetta Chapelsborne, Kelly, PA-C  phentermine 37.5 MG capsule Take 37.5 mg by mouth every morning.    [provider]  polyethylene glycol (MIRALAX / GLYCOLAX) packet Take 17 g by mouth daily as needed. constipation     [provider]  predniSONE (DELTASONE) 20 MG tablet Take 2 tablets (40 mg total) by mouth daily. 05/22/16   Benjiman CorePickering, Nathan, MD  traMADol (ULTRAM) 50 MG tablet Take 1 tablet (50 mg total) by mouth every 6 (six) hours as needed. 10/29/14   Geoffery Lyonselo, Douglas, MD    Family History No family history on file.  Social History Social History   Tobacco Use  . Smoking status: Never Smoker  . Smokeless tobacco: Never Used  Substance Use Topics  . Alcohol use: No  . Drug use: No     Allergies   Patient has no known allergies.   Review of Systems Review of Systems  Constitutional: Negative for chills and fever.  HENT: Negative for congestion and rhinorrhea.   Eyes: Negative for visual disturbance.  Respiratory: Positive for chest tightness. Negative for cough and shortness of breath.   Cardiovascular: Positive for chest pain. Negative for palpitations and leg swelling.  Gastrointestinal: Negative for abdominal pain, nausea and vomiting.  Neurological: Negative for dizziness, syncope, light-headedness and headaches.  All other systems reviewed and are negative.    Physical Exam Updated Vital Signs BP 118/81   Pulse 83   Temp 98.3 F (36.8 C) (Oral)   Resp 14   Ht 5\' 4"  (1.626 m)   Wt 106.6 kg (235 lb)   SpO2 100%   BMI 40.34 kg/m   Physical Exam  Constitutional: She appears well-developed and well-nourished.  No distress.  HENT:  Head: Normocephalic and atraumatic.  Mouth/Throat: Oropharynx is clear and moist.  Eyes: Conjunctivae and EOM are normal. Pupils are equal, round, and reactive to light.  Neck: Normal range of motion. Neck supple.  Cardiovascular: Normal rate, regular rhythm, S1 normal, S2 normal and intact distal pulses.  No murmur heard. Pulses:      Radial pulses are 2+ on the right side, and 2+ on the left side.  No lower extremity edema.  Pulmonary/Chest: Effort normal and breath sounds normal. She has no wheezes. She has no rales.  Abdominal: Soft. She exhibits no distension. There is no tenderness. There is no guarding.  Musculoskeletal: Normal  range of motion. She exhibits no edema or deformity.  Lymphadenopathy:    She has no cervical adenopathy.  Neurological: She is alert.  Patient moves extremities symmetrically and with good coordination. Cranial nerves grossly intact.   Skin: Skin is warm and dry. No rash noted. No erythema.  Psychiatric: She has a normal mood and affect. Her behavior is normal. Judgment and thought content normal.  Nursing note and vitals reviewed.    ED Treatments / Results  Labs (all labs ordered are listed, but only abnormal results are displayed) Labs Reviewed  BASIC METABOLIC PANEL - Abnormal; Notable for the following components:      Result Value   Glucose, Bld 105 (*)    Calcium 8.7 (*)    All other components within normal limits  CBC - Abnormal; Notable for the following components:   Hemoglobin 11.8 (*)    All other components within normal limits  TROPONIN I  PREGNANCY, URINE    EKG  EKG Interpretation None       Radiology Dg Chest 2 View  Result Date: 12/13/2016 CLINICAL DATA:  Chest pain.  Left arm pain. EXAM: CHEST  2 VIEW COMPARISON:  03/21/2016 FINDINGS: The heart size and mediastinal contours are within normal limits. Both lungs are clear. The visualized skeletal structures are unremarkable. IMPRESSION: Normal  exam. Electronically Signed   By: Francene Boyers M.D.   On: 12/13/2016 09:39    Procedures Procedures (including critical care time)  Medications Ordered in ED Medications  acetaminophen (TYLENOL) tablet 650 mg (650 mg Oral Given 12/13/16 0949)  ketorolac (TORADOL) 30 MG/ML injection 30 mg (30 mg Intramuscular Given 12/13/16 1045)     Initial Impression / Assessment and Plan / ED Course  I have reviewed the triage vital signs and the nursing notes.  Pertinent labs & imaging results that were available during my care of the patient were reviewed by me and considered in my medical decision making (see chart for details).  Clinical Course as of Dec 13 1125  Wed Dec 13, 2016  1117 Patient was counseled to return with symptoms concerning for ACS such as severe chest pain, especially if the pain is crushing or pressure-like and spreads to the arms, back, neck, or jaw, or if they have sweating, nausea, or shortness of breath with the pain. They were encouraged to call 911 with these symptoms.   They were also told to return if their chest pain gets worse and does not go away with rest, they have an attack of chest pain lasting longer than usual despite rest and treatment with the medications their caregiver has prescribed, if they wake from sleep with chest pain or shortness of breath, if they feel dizzy or faint, if they have chest pain not typical of their usual pain, or if they have any other emergent concerns regarding their health.  The patient verbalized understanding and agreed.    [AM]    Clinical Course User Index [AM] Elisha Ponder, PA-C     Final Clinical Impressions(s) / ED Diagnoses   Final diagnoses:  Central chest pain   Patient is nontoxic-appearing, afebrile, and in no acute distress.  She is currently comfortable.  Acetaminophen for pain at this time.  Differential diagnosis includes ACS, PE, thoracic aortic dissection, cardiac tamponade, pneumothorax, cholecystitis,  esophageal spasm, gastroesophageal reflux, herpes zoster of the thorax, pericarditis, pneumoni, chest wall pain, costochondritis.   Doubt ACS, as delta troponin is negative, initial and repeat EKGs show no signs  of ischemia, infarction, or arrhythmia, and HEART score 1 (Moderately suspicious history). Doubt PE as Well's score 0 and PERC negative,  and patient not tachycardic. Doubt TAD by hx, CXR showed no widening mediastinum, and pulses equal in all extremities. Patient remained nontoxic appearing and in no acute distress during emergency department course. Vital signs stable in the emergency department. Therefore, doubt esophageal rupture, cardiac tamponade, or pneumothorax. Pericarditis less likely due to no preceding infectious symptoms no associated EKG changes.  Abnormal labs include mild normocytic anemia.  I discussed this with patient, and that it is approximately 0.5 points lower than prior visit. Patient is aware of diagnostic uncertainty and was given strict return precautions.  Patient and family understand and are in agreement with plan of care.  This is a supervised visit with Dr. Loren Racer. Evaluation, management, and discharge planning discussed with this attending physician.  ED Discharge Orders        Ordered    ibuprofen (ADVIL,MOTRIN) 600 MG tablet  Every 6 hours PRN     12/13/16 1116       Delia Chimes 12/13/16 1128    Loren Racer, MD 12/19/16 1504

## 2016-12-13 NOTE — Discharge Instructions (Signed)
Please read and follow all provided instructions.  Your diagnoses today include:  1. Central chest pain     Tests performed today include: An EKG of your heart A chest x-ray Cardiac enzymes - a blood test for heart muscle damage Blood counts and electrolytes Vital signs. See below for your results today.   Medications prescribed:   Take any prescribed medications only as directed.  Follow-up instructions: Please follow-up with your primary care provider as soon as you can for further evaluation of your symptoms.   Please call your cardiologist for further evaluation.  Return instructions:  SEEK IMMEDIATE MEDICAL ATTENTION IF: You have severe chest pain, especially if the pain is crushing or pressure-like and spreads to the arms, back, neck, or jaw, or if you have sweating, nausea (feeling sick to your stomach), or shortness of breath. THIS IS AN EMERGENCY. Don't wait to see if the pain will go away. Get medical help at once. Call 911 or 0 (operator). DO NOT drive yourself to the hospital.  Your chest pain gets worse and does not go away with rest.  You have an attack of chest pain lasting longer than usual, despite rest and treatment with the medications your caregiver has prescribed.  You wake from sleep with chest pain or shortness of breath. You feel dizzy or faint. You have chest pain not typical of your usual pain for which you originally saw your caregiver.  You have any other emergent concerns regarding your health.  Additional Information: Chest pain comes from many different causes. Your caregiver has diagnosed you as having chest pain that is not specific for one problem, but does not require admission.  You are at low risk for an acute heart condition or other serious illness.   Your vital signs today were: BP 114/81    Pulse 81    Temp 98.3 F (36.8 C) (Oral)    Resp 12    Ht 5\' 4"  (1.626 m)    Wt 106.6 kg (235 lb)    SpO2 100%    BMI 40.34 kg/m  If your blood  pressure (BP) was elevated above 135/85 this visit, please have this repeated by your doctor within one month. --------------

## 2016-12-13 NOTE — ED Triage Notes (Signed)
Chest pain under L breast sine last night, sharp in nature, denies N/V.

## 2017-01-28 ENCOUNTER — Emergency Department (HOSPITAL_BASED_OUTPATIENT_CLINIC_OR_DEPARTMENT_OTHER): Payer: No Typology Code available for payment source

## 2017-01-28 ENCOUNTER — Emergency Department (HOSPITAL_BASED_OUTPATIENT_CLINIC_OR_DEPARTMENT_OTHER)
Admission: EM | Admit: 2017-01-28 | Discharge: 2017-01-28 | Disposition: A | Discharge status: Home / Self Care | Payer: No Typology Code available for payment source | Attending: Emergency Medicine | Admitting: Emergency Medicine

## 2017-01-28 ENCOUNTER — Other Ambulatory Visit: Payer: Self-pay

## 2017-01-28 ENCOUNTER — Encounter (HOSPITAL_BASED_OUTPATIENT_CLINIC_OR_DEPARTMENT_OTHER): Payer: Self-pay | Admitting: Emergency Medicine

## 2017-01-28 DIAGNOSIS — S161XXA Strain of muscle, fascia and tendon at neck level, initial encounter: Secondary | ICD-10-CM | POA: Diagnosis not present

## 2017-01-28 DIAGNOSIS — Y92410 Unspecified street and highway as the place of occurrence of the external cause: Secondary | ICD-10-CM | POA: Diagnosis not present

## 2017-01-28 DIAGNOSIS — Y999 Unspecified external cause status: Secondary | ICD-10-CM | POA: Diagnosis not present

## 2017-01-28 DIAGNOSIS — Z79899 Other long term (current) drug therapy: Secondary | ICD-10-CM | POA: Diagnosis not present

## 2017-01-28 DIAGNOSIS — R51 Headache: Secondary | ICD-10-CM | POA: Insufficient documentation

## 2017-01-28 DIAGNOSIS — Y939 Activity, unspecified: Secondary | ICD-10-CM | POA: Diagnosis not present

## 2017-01-28 DIAGNOSIS — R079 Chest pain, unspecified: Secondary | ICD-10-CM | POA: Diagnosis present

## 2017-01-28 MED ORDER — OXYCODONE-ACETAMINOPHEN 5-325 MG PO TABS
1.0000 | ORAL_TABLET | Freq: Once | ORAL | Status: AC
  Administered 2017-01-28: 1 via ORAL
  Filled 2017-01-28: qty 1
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
## 2017-01-28 NOTE — ED Notes (Signed)
Patient transported to X-ray 
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
## 2017-01-28 NOTE — Discharge Instructions (Signed)

## 2018-01-06 ENCOUNTER — Encounter (HOSPITAL_BASED_OUTPATIENT_CLINIC_OR_DEPARTMENT_OTHER): Payer: Self-pay | Admitting: Emergency Medicine

## 2018-01-06 ENCOUNTER — Emergency Department (HOSPITAL_BASED_OUTPATIENT_CLINIC_OR_DEPARTMENT_OTHER)
Admission: EM | Admit: 2018-01-06 | Discharge: 2018-01-06 | Disposition: A | Discharge status: Home / Self Care | Payer: PRIVATE HEALTH INSURANCE | Attending: Emergency Medicine | Admitting: Emergency Medicine

## 2018-01-06 ENCOUNTER — Other Ambulatory Visit: Payer: Self-pay

## 2018-01-06 ENCOUNTER — Emergency Department (HOSPITAL_BASED_OUTPATIENT_CLINIC_OR_DEPARTMENT_OTHER): Payer: PRIVATE HEALTH INSURANCE

## 2018-01-06 DIAGNOSIS — N200 Calculus of kidney: Secondary | ICD-10-CM

## 2018-01-06 DIAGNOSIS — N760 Acute vaginitis: Secondary | ICD-10-CM | POA: Insufficient documentation

## 2018-01-06 DIAGNOSIS — M62830 Muscle spasm of back: Secondary | ICD-10-CM | POA: Diagnosis not present

## 2018-01-06 DIAGNOSIS — N939 Abnormal uterine and vaginal bleeding, unspecified: Secondary | ICD-10-CM | POA: Diagnosis not present

## 2018-01-06 DIAGNOSIS — Z79899 Other long term (current) drug therapy: Secondary | ICD-10-CM | POA: Diagnosis not present

## 2018-01-06 DIAGNOSIS — M549 Dorsalgia, unspecified: Secondary | ICD-10-CM | POA: Diagnosis present

## 2018-01-06 DIAGNOSIS — B9689 Other specified bacterial agents as the cause of diseases classified elsewhere: Secondary | ICD-10-CM | POA: Diagnosis not present

## 2018-01-06 DIAGNOSIS — R102 Pelvic and perineal pain: Secondary | ICD-10-CM | POA: Insufficient documentation

## 2018-01-06 LAB — CBC
HEMATOCRIT: 37.3 % (ref 36.0–46.0)
Hemoglobin: 11.5 g/dL — ABNORMAL LOW (ref 12.0–15.0)
MCH: 27.4 pg (ref 26.0–34.0)
MCHC: 30.8 g/dL (ref 30.0–36.0)
MCV: 88.8 fL (ref 80.0–100.0)
Platelets: 299 10*3/uL (ref 150–400)
RBC: 4.2 MIL/uL (ref 3.87–5.11)
RDW: 12 % (ref 11.5–15.5)
WBC: 5.8 10*3/uL (ref 4.0–10.5)
nRBC: 0 % (ref 0.0–0.2)

## 2018-01-06 LAB — URINALYSIS, ROUTINE W REFLEX MICROSCOPIC
Bilirubin Urine: NEGATIVE
GLUCOSE, UA: NEGATIVE mg/dL
Ketones, ur: NEGATIVE mg/dL
LEUKOCYTES UA: NEGATIVE
Nitrite: NEGATIVE
PH: 6.5 (ref 5.0–8.0)
PROTEIN: NEGATIVE mg/dL
Specific Gravity, Urine: 1.02 (ref 1.005–1.030)

## 2018-01-06 LAB — URINALYSIS, MICROSCOPIC (REFLEX)

## 2018-01-06 LAB — WET PREP, GENITAL
Sperm: NONE SEEN
TRICH WET PREP: NONE SEEN
Yeast Wet Prep HPF POC: NONE SEEN

## 2018-01-06 LAB — PREGNANCY, URINE: Preg Test, Ur: NEGATIVE

## 2018-01-06 MED ORDER — NAPROXEN 500 MG PO TABS: 500.0000 mg | ORAL_TABLET | Freq: Two times a day (BID) | ORAL | 0 refills | Status: AC

## 2018-01-06 MED ORDER — KETOROLAC TROMETHAMINE 30 MG/ML IJ SOLN
30.0000 mg | Freq: Once | INTRAMUSCULAR | Status: AC
  Administered 2018-01-06: 30 mg via INTRAMUSCULAR
  Filled 2018-01-06: qty 1

## 2018-01-06 MED ORDER — METRONIDAZOLE 500 MG PO TABS: 500.0000 mg | ORAL_TABLET | Freq: Two times a day (BID) | ORAL | 0 refills | Status: AC

## 2018-01-06 NOTE — ED Notes (Signed)
Patient transported to CT 

## 2018-01-06 NOTE — Discharge Instructions (Addendum)
°  Nice to see you today Please take a multivitamin with iron to help with low blood counts Please use flexeril as needed for muscle spasm of your back Use naproxen BID for lower abdominal pain and follow up with your OB/gyn to discuss uterine bleeding If your bleeding worsens or you have new/worrisome symptoms please return to be seen.

## 2018-01-06 NOTE — ED Provider Notes (Signed)
[facility] EMERGENCY DEPARTMENT Provider Note   CSN: [id number] Arrival date & time: 01/06/18  1014  History   Chief Complaint Chief Complaint  Patient presents with  . Back Pain    HPI Katherine Patel is a 32 y.o. female presenting with 3 days of pelvic and back pain. She also endorses severe fatigue. She relates the pain to severe menstrual cramps. Reports she had nexplanon replaced about 1 month ago and before then was having a large amount of breakthrough bleeding. She has a known fibroid. Since getting nexplanon replaced she has had continual spotting. She denies injuring her back in any way via lifting or new physical activity. The pain is 9/10 at its worse. It awoke her from her sleep this morning. She tried tylenol and flexeril with minimal relief. She reports she has no concern for STD and she is not sexually active. She denies seeing blood in urine. Last BM was this morning.   HPI  Past Medical History:  Diagnosis Date  . Anemia   . Anxiety   . Depression   . Migraines   . Sciatica     Patient Active Problem List   Diagnosis Date Noted  . Nephrolithiasis 01/06/2018  . Appendicitis 04/27/2013    Past Surgical History:  Procedure Laterality Date  . APPENDECTOMY    . CESAREAN SECTION  06/09/2011  . LAPAROSCOPIC APPENDECTOMY N/A 04/28/2013   Procedure: APPENDECTOMY LAPAROSCOPIC;  Surgeon: Almond LintFaera Byerly, MD;  Location: [facility];  Service: General;  Laterality: N/A;  . TONSILLECTOMY       OB History    Gravida  1   Para  1   Term      Preterm      AB      Living        SAB      TAB      Ectopic      Multiple      Live Births               Home Medications    Prior to Admission medications   Medication Sig Start Date End Date Taking? Authorizing Provider  acetaZOLAMIDE (DIAMOX) 125 MG tablet Take 125 mg by mouth 3 (three) times daily.    [provider]  butalbital-acetaminophen-caffeine (ESGIC PLUS) 50-500-40 MG tablet  Take 1 tablet by mouth every 4 (four) hours as needed for pain.    [provider]  citalopram (CELEXA) 20 MG tablet Take 20 mg by mouth daily.    [provider]  clonazePAM (KLONOPIN) 1 MG tablet Take 1 mg by mouth 2 (two) times daily.    [provider]  clorazepate (TRANXENE) 7.5 MG tablet Take 7.5 mg by mouth 2 (two) times daily as needed for anxiety.    [provider]  cyclobenzaprine (FLEXERIL) 10 MG tablet Take 10 mg by mouth 3 (three) times daily as needed for muscle spasms.    [provider]  divalproex (DEPAKOTE) 500 MG DR tablet Take 500 mg by mouth 3 (three) times daily.    [provider]  etonogestrel (NEXPLANON) 68 MG IMPL implant Inject 1 each into the skin once.    [provider]  ibuprofen (ADVIL,MOTRIN) 600 MG tablet Take 1 tablet (600 mg total) by mouth every 6 (six) hours as needed. 12/13/16   Aviva KluverMurray, Alyssa B, PA-C  LORazepam (ATIVAN) 0.5 MG tablet Take 0.5 mg by mouth every 8 (eight) hours.    [provider]  lubiprostone (  AMITIZA) 24 MCG capsule Take 24 mcg by mouth 2 (two) times daily with a meal.    [provider]  metroNIDAZOLE (FLAGYL) 500 MG tablet Take 1 tablet (500 mg total) by mouth 2 (two) times daily for 7 days. 01/06/18 01/13/18  Tillman Sers, DO  naproxen (NAPROSYN) 500 MG tablet Take 1 tablet (500 mg total) by mouth 2 (two) times daily with a meal for 10 days. 01/06/18 01/16/18  Tillman Sers, DO  omeprazole (PRILOSEC) 10 MG capsule Take 10 mg by mouth daily.    [provider]  ondansetron (ZOFRAN) 4 MG tablet Take 1 tablet (4 mg total) by mouth every 6 (six) hours as needed for nausea or vomiting. 05/20/13   Barnetta Chapel, PA-C  phentermine 37.5 MG capsule Take 37.5 mg by mouth every morning.    [provider]  polyethylene glycol (MIRALAX / GLYCOLAX) packet Take 17 g by mouth daily as needed. constipation     [provider]  topiramate (TOPAMAX)  100 MG tablet Take 100 mg by mouth daily.    [provider]  traMADol (ULTRAM) 50 MG tablet Take 1 tablet (50 mg total) by mouth every 6 (six) hours as needed. 10/29/14   Geoffery Lyons, MD  verapamil (CALAN) 120 MG tablet Take 120 mg by mouth 3 (three) times daily.    [provider]    Family History No family history on file.  Social History Social History   Tobacco Use  . Smoking status: Never Smoker  . Smokeless tobacco: Never Used  Substance Use Topics  . Alcohol use: No  . Drug use: No     Allergies   Depakote [divalproex sodium]   Review of Systems Review of Systems  Constitutional: Positive for activity change, appetite change and fatigue. Negative for chills, diaphoresis and fever.  HENT: Negative for congestion, ear pain, sinus pain and sore throat.   Eyes: Negative for redness.  Respiratory: Negative for cough and shortness of breath.   Cardiovascular: Negative for chest pain.  Gastrointestinal: Positive for abdominal pain. Negative for anal bleeding, blood in stool, constipation, diarrhea, nausea, rectal pain and vomiting.  Genitourinary: Positive for menstrual problem, pelvic pain and vaginal bleeding. Negative for decreased urine volume, difficulty urinating, dyspareunia, dysuria, hematuria, urgency, vaginal discharge and vaginal pain.  Musculoskeletal: Positive for back pain. Negative for gait problem, myalgias and neck pain.  Skin: Negative for rash.  Neurological: Negative for weakness, numbness and headaches.  Psychiatric/Behavioral: Negative for behavioral problems.     Physical Exam Updated Vital Signs BP 122/88 (BP Location: Right Arm)   Pulse 64   Temp 98.5 F (36.9 C) (Oral)   Resp 18   Ht 5\' 4"  (1.626 m)   Wt 109.3 kg   SpO2 100%   BMI 41.37 kg/m   Physical Exam Vitals signs and nursing note reviewed. Exam conducted with a chaperone present.  Constitutional:      Appearance: Normal appearance.  HENT:     Head:  Normocephalic.     Nose: Nose normal.     Mouth/Throat:     Mouth: Mucous membranes are moist.  Eyes:     Pupils: Pupils are equal, round, and reactive to light.  Neck:     Musculoskeletal: Normal range of motion and neck supple.  Cardiovascular:     Rate and Rhythm: Normal rate and regular rhythm.  Pulmonary:     Effort: Pulmonary effort is normal. No respiratory distress.  Abdominal:  General: There is no distension.     Palpations: Abdomen is soft. There is no mass.     Tenderness: There is abdominal tenderness. There is left CVA tenderness. There is no right CVA tenderness or guarding.  Genitourinary:    General: Normal vulva.     Vagina: Bleeding present.     Cervix: Normal.  Musculoskeletal: Normal range of motion.        General: No swelling.     Lumbar back: She exhibits spasm.  Skin:    General: Skin is warm.     Capillary Refill: Capillary refill takes less than 2 seconds.  Neurological:     General: No focal deficit present.     Mental Status: She is alert and oriented to person, place, and time.     Cranial Nerves: No cranial nerve deficit.     Motor: No weakness.  Psychiatric:        Mood and Affect: Mood normal.        Behavior: Behavior normal.      ED Treatments / Results  Labs (all labs ordered are listed, but only abnormal results are displayed) Labs Reviewed  WET PREP, GENITAL - Abnormal; Notable for the following components:      Result Value   Clue Cells Wet Prep HPF POC PRESENT (*)    WBC, Wet Prep HPF POC MANY (*)    All other components within normal limits  URINALYSIS, ROUTINE W REFLEX MICROSCOPIC - Abnormal; Notable for the following components:   Hgb urine dipstick LARGE (*)    All other components within normal limits  URINALYSIS, MICROSCOPIC (REFLEX) - Abnormal; Notable for the following components:   Bacteria, UA MANY (*)    All other components within normal limits  CBC - Abnormal; Notable for the following components:    Hemoglobin 11.5 (*)    All other components within normal limits  PREGNANCY, URINE  GC/CHLAMYDIA PROBE AMP ([place]    EKG None  Radiology Ct Renal Stone Study  Result Date: 01/06/2018 CLINICAL DATA:  Left back and pelvic pain for multiple days. EXAM: CT ABDOMEN AND PELVIS WITHOUT CONTRAST TECHNIQUE: Multidetector CT imaging of the abdomen and pelvis was performed following the standard protocol without IV contrast. COMPARISON:  None. FINDINGS: Lower chest: Normal heart size. Lung bases are clear. No pleural effusion. Hepatobiliary: Liver is normal in size and contour. Gallbladder is unremarkable. No intrahepatic or extrahepatic biliary ductal dilatation. Pancreas: Few scattered calcifications demonstrated raising the possibility of chronic calcific pancreatitis. Spleen: Unremarkable Adrenals/Urinary Tract: Normal adrenal glands. Kidneys are symmetric in size. 3 mm stone inferior pole right kidney (image 36; series 2). No ureterolithiasis. No hydronephrosis. Urinary bladder is unremarkable. Stomach/Bowel: Normal morphology of the stomach. No evidence for small bowel obstruction. No free fluid or free intraperitoneal air. Vascular/Lymphatic: Normal caliber abdominal aorta. No retroperitoneal lymphadenopathy. Reproductive: Exophytic calcified mass off the anterior uterine fundus most compatible with fibroid. Adnexal structures unremarkable. Other: None. Musculoskeletal: Lumbar spine degenerative changes. No aggressive or acute appearing osseous lesions. IMPRESSION: 1. No acute process within the abdomen or pelvis. 2. Nonobstructing right nephrolithiasis. Electronically Signed   By: Annia Beltrew  Davis M.D.   On: 01/06/2018 12:24    Procedures Procedures (including critical care time)  Medications Ordered in ED Medications  ketorolac (TORADOL) 30 MG/ML injection 30 mg (30 mg Intramuscular Given 01/06/18 1222)     Initial Impression / Assessment and Plan / ED Course  I have reviewed  the triage vital  signs and the nursing notes.  Pertinent labs & imaging results that were available during my care of the patient were reviewed by me and considered in my medical decision making (see chart for details).     5959: 32 year old with anxiety, hx anemia, fibroid presenting with 3 days of low back and pelvic pain. Lumbar paraspinal musculature tender to palpation, tender in suprapubic region to palpation. UA with blood. Upreg negative. Toradol ordered for pain.   1240: Toradol improved pain. Pelvic exam with moderate amount of blood in vaginal canal, no CMT. Body habitus made exam difficult. CBC with hemoglobin of 11.5 slightly decreased from value in October of 12.4, CT renal stone study reveals right kidney nephrolithiasis, nonobstructing, and stable uterine fibroid. Wet prep with clue cells, will treat w/ flagyl for BV. Gc/chalmydia pending. Discussed results with patient. Ultimately I feel she needs to follow up with her ob/gyn to discuss breakthrough bleeding on nexplanon. She is agreeable. She has flexeril at home to use for back spasms. Will give naproxen to take at home additionally. Advised she take daily multivitamin with iron. Stable for discharge home. Reasons to return reviewed including worsening bleeding, new or worrisome symptoms. Patient verbalized understanding and agreement with plan.   Final Clinical Impressions(s) / ED Diagnoses   Final diagnoses:  Muscle spasm of back  Nephrolithiasis  Abnormal uterine bleeding  Bacterial vaginosis    ED Discharge Orders         Ordered    naproxen (NAPROSYN) 500 MG tablet  2 times daily with meals     01/06/18 1350    metroNIDAZOLE (FLAGYL) 500 MG tablet  2 times daily     01/06/18 1350           Tillman Sers, DO 01/06/18 1352    Maia Plan, MD 01/06/18 [phone]

## 2018-01-06 NOTE — ED Triage Notes (Signed)
Low back pain radiating into her abd since Friday. Denies urinary symptoms. Endorses vaginal spotting, states she has a new implant in place since November. Denies discharge.

## 2018-01-07 LAB — GC/CHLAMYDIA PROBE AMP (~~LOC~~) NOT AT ARMC
CHLAMYDIA, DNA PROBE: NEGATIVE
NEISSERIA GONORRHEA: NEGATIVE

## 2018-03-04 ENCOUNTER — Emergency Department (HOSPITAL_BASED_OUTPATIENT_CLINIC_OR_DEPARTMENT_OTHER)
Admission: EM | Admit: 2018-03-04 | Discharge: 2018-03-04 | Disposition: A | Discharge status: Home / Self Care | Payer: PRIVATE HEALTH INSURANCE | Attending: Emergency Medicine | Admitting: Emergency Medicine

## 2018-03-04 ENCOUNTER — Other Ambulatory Visit: Payer: Self-pay

## 2018-03-04 ENCOUNTER — Encounter (HOSPITAL_BASED_OUTPATIENT_CLINIC_OR_DEPARTMENT_OTHER): Payer: Self-pay | Admitting: Emergency Medicine

## 2018-03-04 DIAGNOSIS — M545 Low back pain, unspecified: Secondary | ICD-10-CM

## 2018-03-04 DIAGNOSIS — Z79899 Other long term (current) drug therapy: Secondary | ICD-10-CM | POA: Insufficient documentation

## 2018-03-04 MED ORDER — PREDNISONE 20 MG PO TABS: 40.0000 mg | ORAL_TABLET | Freq: Every day | ORAL | 0 refills | Status: DC

## 2018-03-04 MED ORDER — HYDROCODONE-ACETAMINOPHEN 5-325 MG PO TABS: 1.0000 | ORAL_TABLET | Freq: Four times a day (QID) | ORAL | 0 refills | Status: DC | PRN

## 2018-03-04 MED ORDER — HYDROMORPHONE HCL 1 MG/ML IJ SOLN
2.0000 mg | Freq: Once | INTRAMUSCULAR | Status: AC
  Administered 2018-03-04: 2 mg via INTRAMUSCULAR
  Filled 2018-03-04: qty 2

## 2018-03-04 MED ORDER — KETOROLAC TROMETHAMINE 60 MG/2ML IM SOLN
60.0000 mg | Freq: Once | INTRAMUSCULAR | Status: AC
  Administered 2018-03-04: 60 mg via INTRAMUSCULAR
  Filled 2018-03-04: qty 2

## 2018-03-04 MED ORDER — ONDANSETRON 4 MG PO TBDP
4.0000 mg | ORAL_TABLET | Freq: Once | ORAL | Status: AC
  Administered 2018-03-04: 4 mg via ORAL
  Filled 2018-03-04: qty 1

## 2018-03-04 MED ORDER — TIZANIDINE HCL 4 MG PO TABS: 4.0000 mg | ORAL_TABLET | Freq: Four times a day (QID) | ORAL | 0 refills | Status: DC | PRN

## 2018-03-04 MED ORDER — DIPHENHYDRAMINE HCL 25 MG PO CAPS
25.0000 mg | ORAL_CAPSULE | Freq: Once | ORAL | Status: AC
  Administered 2018-03-04: 25 mg via ORAL
  Filled 2018-03-04: qty 1

## 2018-03-04 MED FILL — tiZANidine HCL 4 MG TABS: 4 | 7 days supply | Qty: 30 | Fill #0

## 2018-03-04 MED FILL — HYDROCODON-APAP 5-325: 5-325 | 2 days supply | Qty: 10 | Fill #0

## 2018-03-04 MED FILL — predniSONE 20 MG TABS: 20 | 5 days supply | Qty: 10 | Fill #0

## 2018-03-04 NOTE — ED Triage Notes (Signed)
Lower back pain for 3 weeks, which has been treated one week ago.  Pt states she reached to get something out of the car and the pain worsened.   Some numbness in the lower back.

## 2018-03-04 NOTE — ED Provider Notes (Signed)
[facility] EMERGENCY DEPARTMENT Provider Note   CSN: [id number] Arrival date & time: 03/04/18  0827    History   Chief Complaint Chief Complaint  Patient presents with  . Back Pain    HPI Katherine Patel is a 33 y.o. female.     The history is provided by the patient.  Back Pain  Location:  Lumbar spine Quality:  Shooting, stabbing and stiffness Stiffness is present:  In the morning Radiates to:  Does not radiate Pain severity:  Moderate Pain is:  Same all the time Onset quality:  Gradual Duration:  3 weeks Timing:  Constant Progression:  Worsening Chronicity:  Recurrent Context: lifting heavy objects   Relieved by:  Nothing Worsened by:  Bending and twisting Ineffective treatments:  NSAIDs, muscle relaxants, heating pad and being still Associated symptoms: no abdominal pain, no bladder incontinence, no bowel incontinence, no chest pain, no dysuria, no fever, no headaches, no leg pain, no numbness, no paresthesias and no weakness   Risk factors: obesity     Past Medical History:  Diagnosis Date  . Anemia   . Anxiety   . Depression   . Migraines   . Sciatica     Patient Active Problem List   Diagnosis Date Noted  . Nephrolithiasis 01/06/2018  . Appendicitis 04/27/2013    Past Surgical History:  Procedure Laterality Date  . APPENDECTOMY    . CESAREAN SECTION  06/09/2011  . LAPAROSCOPIC APPENDECTOMY N/A 04/28/2013   Procedure: APPENDECTOMY LAPAROSCOPIC;  Surgeon: Almond Lint, MD;  Location: [facility];  Service: General;  Laterality: N/A;  . TONSILLECTOMY       OB History    Gravida  1   Para  1   Term      Preterm      AB      Living        SAB      TAB      Ectopic      Multiple      Live Births               Home Medications    Prior to Admission medications   Medication Sig Start Date End Date Taking? Authorizing Provider  acetaZOLAMIDE (DIAMOX) 125 MG tablet Take 125 mg by mouth 3 (three) times daily.     [provider]  butalbital-acetaminophen-caffeine (ESGIC PLUS) 50-500-40 MG tablet Take 1 tablet by mouth every 4 (four) hours as needed for pain.    [provider]  citalopram (CELEXA) 20 MG tablet Take 20 mg by mouth daily.    [provider]  clonazePAM (KLONOPIN) 1 MG tablet Take 1 mg by mouth 2 (two) times daily.    [provider]  clorazepate (TRANXENE) 7.5 MG tablet Take 7.5 mg by mouth 2 (two) times daily as needed for anxiety.    [provider]  cyclobenzaprine (FLEXERIL) 10 MG tablet Take 10 mg by mouth 3 (three) times daily as needed for muscle spasms.    [provider]  divalproex (DEPAKOTE) 500 MG DR tablet Take 500 mg by mouth 3 (three) times daily.    [provider]  etonogestrel (NEXPLANON) 68 MG IMPL implant Inject 1 each into the skin once.    [provider]  ibuprofen (ADVIL,MOTRIN) 600 MG tablet Take 1 tablet (600 mg total) by mouth every 6 (six) hours as needed. 12/13/16   Aviva Kluver B, PA-C  LORazepam (ATIVAN) 0.5 MG tablet Take 0.5 mg by mouth  every 8 (eight) hours.    [provider]  lubiprostone (AMITIZA) 24 MCG capsule Take 24 mcg by mouth 2 (two) times daily with a meal.    [provider]  omeprazole (PRILOSEC) 10 MG capsule Take 10 mg by mouth daily.    [provider]  ondansetron (ZOFRAN) 4 MG tablet Take 1 tablet (4 mg total) by mouth every 6 (six) hours as needed for nausea or vomiting. 05/20/13   Barnetta Chapelsborne, Kelly, PA-C  phentermine 37.5 MG capsule Take 37.5 mg by mouth every morning.    [provider]  polyethylene glycol (MIRALAX / GLYCOLAX) packet Take 17 g by mouth daily as needed. constipation     [provider]  topiramate (TOPAMAX) 100 MG tablet Take 100 mg by mouth daily.    [provider]  traMADol (ULTRAM) 50 MG tablet Take 1 tablet (50 mg total) by mouth every 6 (six) hours as needed. 10/29/14   Geoffery Lyonselo, Douglas, MD    verapamil (CALAN) 120 MG tablet Take 120 mg by mouth 3 (three) times daily.    [provider]    Family History No family history on file.  Social History Social History   Tobacco Use  . Smoking status: Never Smoker  . Smokeless tobacco: Never Used  Substance Use Topics  . Alcohol use: No  . Drug use: No     Allergies   Depakote [divalproex sodium]   Review of Systems Review of Systems  Constitutional: Negative for fever.  Cardiovascular: Negative for chest pain.  Gastrointestinal: Negative for abdominal pain and bowel incontinence.  Genitourinary: Negative for bladder incontinence and dysuria.  Musculoskeletal: Positive for back pain.  Neurological: Negative for weakness, numbness, headaches and paresthesias.  All other systems reviewed and are negative.    Physical Exam Updated Vital Signs BP (!) 145/95   Pulse 87   Temp 97.8 F (36.6 C)   Resp 18   Ht 5\' 4"  (1.626 m)   Wt 109.3 kg   SpO2 98%   BMI 41.37 kg/m   Physical Exam Vitals signs and nursing note reviewed.  Constitutional:      General: She is not in acute distress.    Appearance: Normal appearance. She is well-developed.     Comments: Appears uncomfortable  HENT:     Head: Normocephalic and atraumatic.  Eyes:     Pupils: Pupils are equal, round, and reactive to light.  Cardiovascular:     Rate and Rhythm: Normal rate and regular rhythm.     Heart sounds: Normal heart sounds. No murmur. No friction rub.  Pulmonary:     Effort: Pulmonary effort is normal.     Breath sounds: Normal breath sounds. No wheezing or rales.  Abdominal:     General: Bowel sounds are normal. There is no distension.     Palpations: Abdomen is soft.     Tenderness: There is no abdominal tenderness. There is no guarding or rebound.  Musculoskeletal:        General: Tenderness present.     Lumbar back: She exhibits decreased range of motion, tenderness, bony tenderness and pain. She exhibits normal pulse.        Back:     Comments: No edema  Skin:    General: Skin is warm and dry.     Findings: No rash.  Neurological:     General: No focal deficit present.     Mental Status: She is alert and oriented to person, place, and time.  Mental status is at baseline.     Cranial Nerves: No cranial nerve deficit.     Sensory: No sensory deficit.     Motor: No weakness.  Psychiatric:        Mood and Affect: Mood normal.        Behavior: Behavior normal.        Thought Content: Thought content normal.      ED Treatments / Results  Labs (all labs ordered are listed, but only abnormal results are displayed) Labs Reviewed - No data to display  EKG None  Radiology No results found.  Procedures Procedures (including critical care time)  Medications Ordered in ED Medications  HYDROmorphone (DILAUDID) injection 2 mg (2 mg Intramuscular Given 03/04/18 0946)  ketorolac (TORADOL) injection 60 mg (60 mg Intramuscular Given 03/04/18 0946)  ondansetron (ZOFRAN-ODT) disintegrating tablet 4 mg (4 mg Oral Given 03/04/18 0945)     Initial Impression / Assessment and Plan / ED Course  I have reviewed the triage vital signs and the nursing notes.  Pertinent labs & imaging results that were available during my care of the patient were reviewed by me and considered in my medical decision making (see chart for details).       Pt with gradual onset of back pain suggestive of arthritis or DDD.  No neurovascular compromise and no incontinence.  Pt has no infectious sx, hx of CA  or other red flags concerning for pathologic back pain.  Patient had epidural injections for similar back pain years ago but had not been having issues till 3 weeks ago.  Pt is able to ambulate but is painful.  Normal strength and reflexes on exam.  Denies trauma. Will give pt pain control and to return for developement of above sx. with Toradol and Dilaudid patient received here she did have some itching and was given oral  Benadryl.  She will be discharged home with prednisone, tizanidine and pain control.  She is planning on seeing who her insurance provider will cover and then following up with neurosurgery.   Final Clinical Impressions(s) / ED Diagnoses   Final diagnoses:  Acute bilateral low back pain without sciatica    ED Discharge Orders         Ordered    tiZANidine (ZANAFLEX) 4 MG tablet  Every 6 hours PRN     03/04/18 1048    predniSONE (DELTASONE) 20 MG tablet  Daily     03/04/18 1048    HYDROcodone-acetaminophen (NORCO/VICODIN) 5-325 MG tablet  Every 6 hours PRN     03/04/18 1048           Gwyneth Sprout, MD 03/04/18 1048

## 2018-03-04 NOTE — Discharge Instructions (Signed)
Using heating pads and trying to move as often as you can will help to prevent things from getting stiff.  If you develop fever, difficulty urinating or having bowel incontinence or unable to move one leg you need to return immediately for an MRI.

## 2018-10-30 ENCOUNTER — Emergency Department (HOSPITAL_BASED_OUTPATIENT_CLINIC_OR_DEPARTMENT_OTHER)
Admission: EM | Admit: 2018-10-30 | Discharge: 2018-10-30 | Disposition: A | Discharge status: Home / Self Care | Payer: PRIVATE HEALTH INSURANCE | Attending: Emergency Medicine | Admitting: Emergency Medicine

## 2018-10-30 ENCOUNTER — Emergency Department (HOSPITAL_BASED_OUTPATIENT_CLINIC_OR_DEPARTMENT_OTHER): Payer: PRIVATE HEALTH INSURANCE

## 2018-10-30 ENCOUNTER — Other Ambulatory Visit: Payer: Self-pay

## 2018-10-30 ENCOUNTER — Encounter (HOSPITAL_BASED_OUTPATIENT_CLINIC_OR_DEPARTMENT_OTHER): Payer: Self-pay

## 2018-10-30 DIAGNOSIS — Z79899 Other long term (current) drug therapy: Secondary | ICD-10-CM | POA: Diagnosis not present

## 2018-10-30 DIAGNOSIS — R0789 Other chest pain: Secondary | ICD-10-CM | POA: Diagnosis present

## 2018-10-30 DIAGNOSIS — M940 Chondrocostal junction syndrome [Tietze]: Secondary | ICD-10-CM | POA: Diagnosis not present

## 2018-10-30 LAB — BASIC METABOLIC PANEL
Anion gap: 8 (ref 5–15)
BUN: 9 mg/dL (ref 6–20)
CO2: 25 mmol/L (ref 22–32)
Calcium: 9.2 mg/dL (ref 8.9–10.3)
Chloride: 106 mmol/L (ref 98–111)
Creatinine, Ser: 0.82 mg/dL (ref 0.44–1.00)
GFR calc Af Amer: >60 mL/min (ref >60)
GFR calc non Af Amer: >60 mL/min (ref >60)
Glucose, Bld: 115 mg/dL — ABNORMAL HIGH (ref 70–99)
Potassium: 3.6 mmol/L (ref 3.5–5.1)
Sodium: 139 mmol/L (ref 135–145)

## 2018-10-30 LAB — CBC
HCT: 38.9 % (ref 36.0–46.0)
Hemoglobin: 12 g/dL (ref 12.0–15.0)
MCH: 27.3 pg (ref 26.0–34.0)
MCHC: 30.8 g/dL (ref 30.0–36.0)
MCV: 88.6 fL (ref 80.0–100.0)
Platelets: 327 10*3/uL (ref 150–400)
RBC: 4.39 MIL/uL (ref 3.87–5.11)
RDW: 11.8 % (ref 11.5–15.5)
WBC: 6.2 10*3/uL (ref 4.0–10.5)
nRBC: 0 % (ref 0.0–0.2)

## 2018-10-30 LAB — TROPONIN I (HIGH SENSITIVITY): Troponin I (High Sensitivity): <2 ng/L (ref <18)

## 2018-10-30 MED ORDER — IBUPROFEN 800 MG PO TABS: 800.0000 mg | ORAL_TABLET | Freq: Three times a day (TID) | ORAL | 0 refills | Status: AC

## 2018-10-30 MED ORDER — SODIUM CHLORIDE 0.9% FLUSH
3.0000 mL | Freq: Once | INTRAVENOUS | Status: DC
  Filled 2018-10-30: qty 3

## 2018-10-30 MED FILL — IBUPROFEN 800 MG TAB: 800 | 10 days supply | Qty: 30 | Fill #0

## 2018-10-30 NOTE — Discharge Instructions (Signed)
Warm or cold compresses to your chest as needed. Take Motrin as prescribed and complete the full course.  You should take this with food, stop taking Motrin if you develop abdominal pain. Recheck with your doctor in the next week, return to ER for new or worsening symptoms.

## 2018-10-30 NOTE — ED Notes (Signed)
Pt aware of need for urine specimen when able, although states pregnancy is not a concern.

## 2018-10-30 NOTE — ED Triage Notes (Addendum)
Pt c/o CP x 3 days-denies fever/flu like sx-NAD-steady gait 

## 2018-10-30 NOTE — ED Provider Notes (Addendum)
[facility] EMERGENCY DEPARTMENT Provider Note   CSN: [id number] Arrival date & time: 10/30/18  1326     History   Chief Complaint Chief Complaint  Patient presents with  . Chest Pain    HPI Katherine Patel is a 33 y.o. female.  HPI: A 33 year old patient with a history of obesity presents for evaluation of chest pain. Initial onset of pain was more than 6 hours ago. The patient's chest pain is described as heaviness/pressure/tightness and is not worse with exertion. The patient's chest pain is middle- or left-sided, is not well-localized, is not sharp and does not radiate to the arms/jaw/neck. The patient does not complain of nausea and denies diaphoresis. The patient has no history of stroke, has no history of peripheral artery disease, has not smoked in the past 90 days, denies any history of treated diabetes, has no relevant family history of coronary artery disease (first degree relative at less than age 34), is not hypertensive and has no history of hypercholesterolemia.   33yo female with history of migraines complaint of chest pain, onset last night, constant. Today while at work patient felt weak, fatigued, pain with deep inspiration and exertional dyspnea. Pain is heavy in the center of her chest, with nausea. No recent illness, denies cough, wheezing, sputum production, reflux. Patient was seen by cardiology previously for presyncope with t-wave inversion, had an EKG and advised to follow up if symptoms progressed, were felt to be non cardiac.   No history of HTN, diabetes, hyperlipidemia, patient is a non smoker. No significant family history. Not on OCP, no extended travel, no unilateral leg swelling, no personal or family history of PE/DVT.     Past Medical History:  Diagnosis Date  . Anemia   . Anxiety   . Depression   . Migraines   . Sciatica     Patient Active Problem List   Diagnosis Date Noted  . Nephrolithiasis 01/06/2018  . Appendicitis  04/27/2013    Past Surgical History:  Procedure Laterality Date  . APPENDECTOMY    . CESAREAN SECTION  06/09/2011  . LAPAROSCOPIC APPENDECTOMY N/A 04/28/2013   Procedure: APPENDECTOMY LAPAROSCOPIC;  Surgeon: Almond LintFaera Byerly, MD;  Location: [facility] ORS;  Service: General;  Laterality: N/A;  . TONSILLECTOMY       OB History    Gravida  1   Para  1   Term      Preterm      AB      Living        SAB      TAB      Ectopic      Multiple      Live Births               Home Medications    Prior to Admission medications   Medication Sig Start Date End Date Taking? Authorizing Provider  clorazepate (TRANXENE) 7.5 MG tablet Take 7.5 mg by mouth 2 (two) times daily as needed for anxiety.   Yes [provider]  omeprazole (PRILOSEC) 10 MG capsule Take 10 mg by mouth daily.   Yes [provider]  topiramate (TOPAMAX) 100 MG tablet Take 100 mg by mouth daily.   Yes [provider]  acetaZOLAMIDE (DIAMOX) 125 MG tablet Take 125 mg by mouth 3 (three) times daily.    [provider]  clonazePAM (KLONOPIN) 1 MG tablet Take 1 mg by mouth 2 (two) times daily.    [provider]  ibuprofen (ADVIL)  800 MG tablet Take 1 tablet (800 mg total) by mouth 3 (three) times daily for 10 days. 10/30/18 11/09/18  Jeannie Fend, PA-C  lubiprostone (AMITIZA) 24 MCG capsule Take 24 mcg by mouth 2 (two) times daily with a meal.    [provider]  ondansetron (ZOFRAN) 4 MG tablet Take 1 tablet (4 mg total) by mouth every 6 (six) hours as needed for nausea or vomiting. 05/20/13   Barnetta Chapel, PA-C  phentermine 37.5 MG capsule Take 37.5 mg by mouth every morning.    [provider]  polyethylene glycol (MIRALAX / GLYCOLAX) packet Take 17 g by mouth daily as needed. constipation     [provider]  citalopram (CELEXA) 20 MG tablet Take 20 mg by mouth daily.  10/30/18  [provider]  divalproex (DEPAKOTE) 500 MG DR tablet  Take 500 mg by mouth 3 (three) times daily.  10/30/18  [provider]  etonogestrel (NEXPLANON) 68 MG IMPL implant Inject 1 each into the skin once.  10/30/18  [provider]  verapamil (CALAN) 120 MG tablet Take 120 mg by mouth 3 (three) times daily.  10/30/18  [provider]    Family History No family history on file.  Social History Social History   Tobacco Use  . Smoking status: Never Smoker  . Smokeless tobacco: Never Used  Substance Use Topics  . Alcohol use: No  . Drug use: No     Allergies   Depakote [divalproex sodium]   Review of Systems Review of Systems  Constitutional: Negative for chills, diaphoresis and fever.  HENT: Negative for congestion.   Respiratory: Negative for cough, chest tightness and shortness of breath.   Cardiovascular: Positive for chest pain. Negative for palpitations and leg swelling.  Gastrointestinal: Negative for abdominal pain, nausea and vomiting.  Musculoskeletal: Negative for arthralgias and myalgias.  Skin: Negative for rash and wound.  Allergic/Immunologic: Negative for immunocompromised state.  Neurological: Negative for dizziness, weakness and headaches.  Psychiatric/Behavioral: Negative for confusion.  All other systems reviewed and are negative.    Physical Exam Updated Vital Signs BP 127/81   Pulse 73   Temp 98.6 F (37 C) (Oral)   Resp 15   Ht 5\' 4"  (1.626 m)   Wt 113.4 kg   LMP 10/17/2018   SpO2 100%   BMI 42.91 kg/m   Physical Exam Vitals signs and nursing note reviewed.  Constitutional:      General: She is not in acute distress.    Appearance: She is well-developed. She is not diaphoretic.  HENT:     Head: Normocephalic and atraumatic.  Cardiovascular:     Rate and Rhythm: Normal rate and regular rhythm.     Heart sounds: Normal heart sounds. No murmur.  Pulmonary:     Effort: Pulmonary effort is normal.     Breath sounds: Normal breath sounds.  Chest:     Chest wall:  Tenderness present. No deformity or crepitus.    Abdominal:     Palpations: Abdomen is soft.  Musculoskeletal:     Right lower leg: She exhibits no tenderness. No edema.     Left lower leg: She exhibits no tenderness. No edema.  Skin:    General: Skin is warm and dry.     Findings: No rash.  Neurological:     Mental Status: She is alert and oriented to person, place, and time.  Psychiatric:        Behavior: Behavior normal.  ED Treatments / Results  Labs (all labs ordered are listed, but only abnormal results are displayed) Labs Reviewed  BASIC METABOLIC PANEL - Abnormal; Notable for the following components:      Result Value   Glucose, Bld 115 (*)    All other components within normal limits  CBC  PREGNANCY, URINE  TROPONIN I (HIGH SENSITIVITY)    EKG EKG Interpretation  Date/Time:  Wednesday October 30 2018 13:37:16 EDT Ventricular Rate:  82 PR Interval:  178 QRS Duration: 72 QT Interval:  372 QTC Calculation: 434 R Axis:   34 Text Interpretation:  Normal sinus rhythm Normal ECG Confirmed by Madalyn Rob [phone]) on 10/30/2018 3:10:04 PM   Radiology Dg Chest 2 View  Result Date: 10/30/2018 CLINICAL DATA:  Chest pain EXAM: CHEST - 2 VIEW COMPARISON:  September 14, 2017 FINDINGS: Lungs are clear. Heart size and pulmonary vascularity are normal. No adenopathy. No bone lesions. No pneumothorax. IMPRESSION: No edema or consolidation. Electronically Signed   By: Lowella Grip III M.D.   On: 10/30/2018 14:03    Procedures Procedures (including critical care time)  Medications Ordered in ED Medications  sodium chloride flush (NS) 0.9 % injection 3 mL (3 mLs Intravenous Not Given 10/30/18 1424)     Initial Impression / Assessment and Plan / ED Course  I have reviewed the triage vital signs and the nursing notes.  Pertinent labs & imaging results that were available during my care of the patient were reviewed by me and considered in my medical  decision making (see chart for details).  Clinical Course as of Oct 29 1513  Wed Oct 29, 4636  3516 33 year old female presents with complaint of midsternal chest pain onset last night, constant, worse with palpation or taking a deep breath.  No significant cardiac risk factors, no significant risk factors for PE, no significant family history.  On exam pain is reproduced with palpation to the sternum.  There is no leg swelling, heart and lung sounds normal.  Discussed costochondritis with patient, recommend warm or cold compresses and course of Motrin.  Follow-up with PCP as needed hoarseness.  Lab work including CBC, BMP, troponin within normal limits.  Chest x-ray unremarkable.  EKG without acute ischemic changes.   [LM]    Clinical Course User Index [LM] Roque Lias    [facility] Score: 2 Final Clinical Impressions(s) / ED Diagnoses   Final diagnoses:  Costochondritis    ED Discharge Orders         Ordered    ibuprofen (ADVIL) 800 MG tablet  3 times daily     10/30/18 1510           Tacy Learn, PA-C 10/30/18 1513    Tacy Learn, PA-C 10/30/18 1516    Lucrezia Starch, MD 10/31/18 [phone]

## 2020-01-22 IMAGING — CT CT RENAL STONE PROTOCOL
2 of 4 series · 17 of 46 positions shown, 19 images · non-contrast
Comparison: None.

CLINICAL DATA: Left back and pelvic pain for multiple days.

EXAM:
CT ABDOMEN AND PELVIS WITHOUT CONTRAST
TECHNIQUE: Multidetector CT imaging of the abdomen and pelvis was performed
following the standard protocol without IV contrast.

[Series 2: axial st · axial · 0.86mm/px · z∈[-430,-30]mm · 14 of 88 slices shown, 16 images]
[im 4/88  soft-tissue]
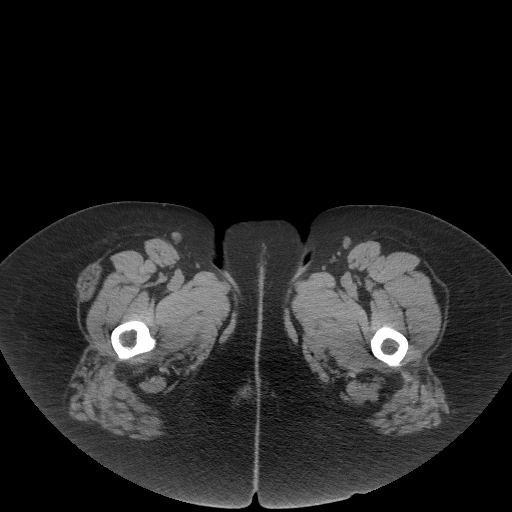
[im 4/88  bone]
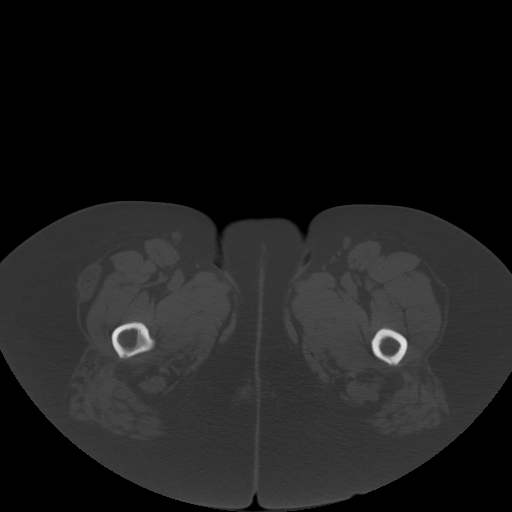
[im 11/88  soft-tissue]
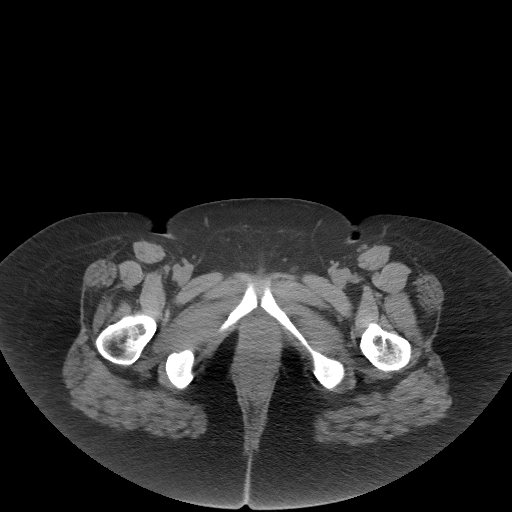
[im 17/88  soft-tissue]
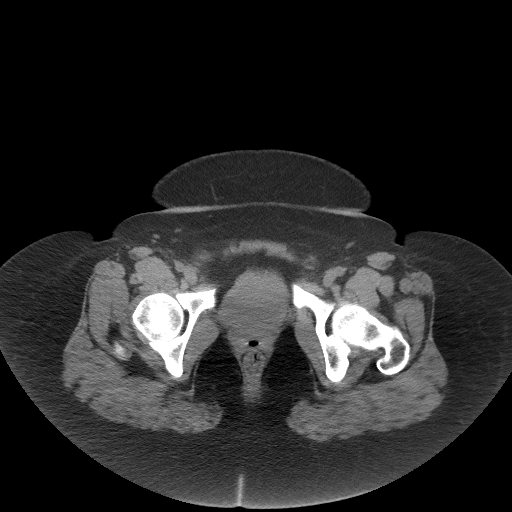
[im 24/88  soft-tissue]
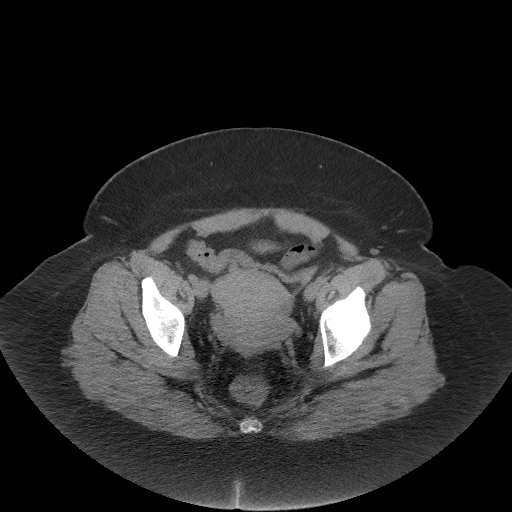
[im 31/88  soft-tissue]
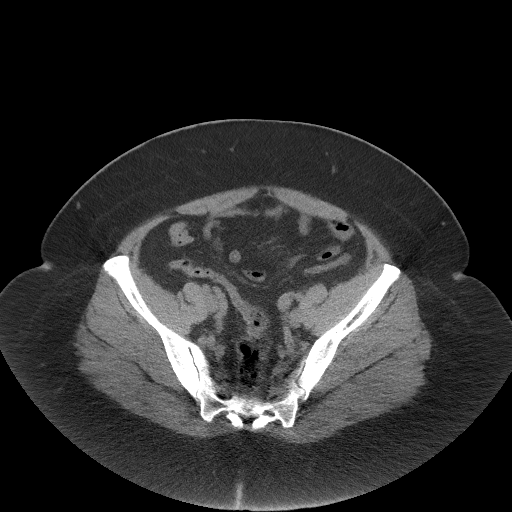
[im 34/88  soft-tissue]
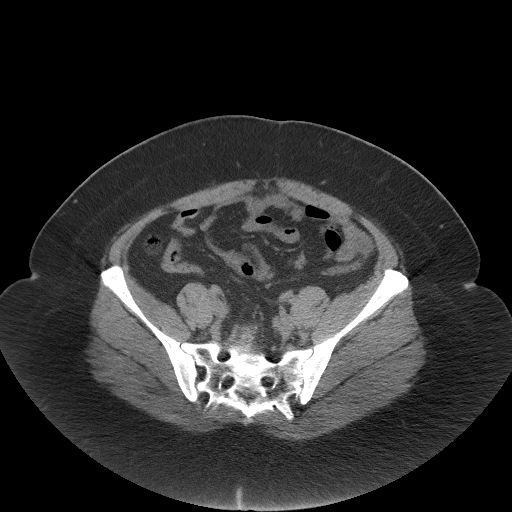
[im 41/88  soft-tissue]
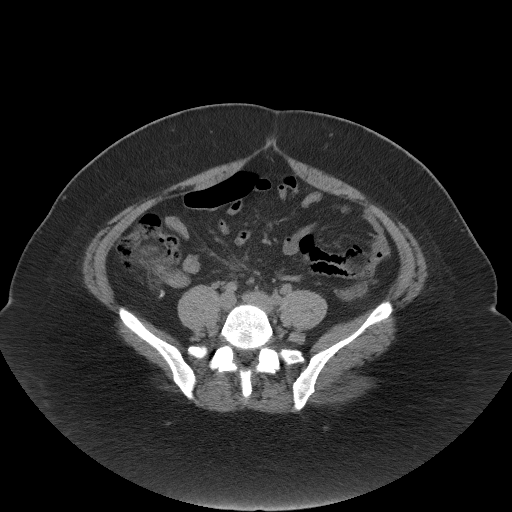
[im 47/88  soft-tissue]
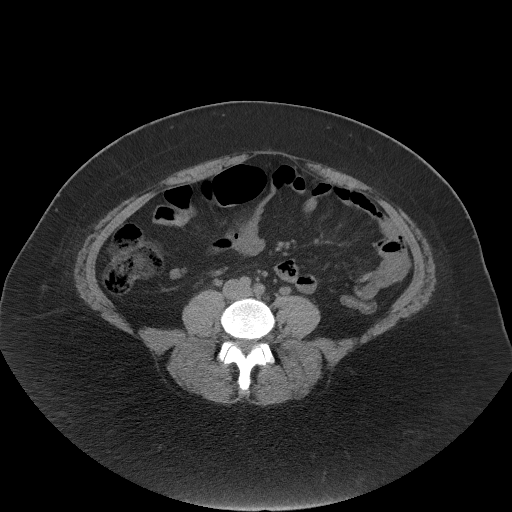
[im 54/88  soft-tissue]
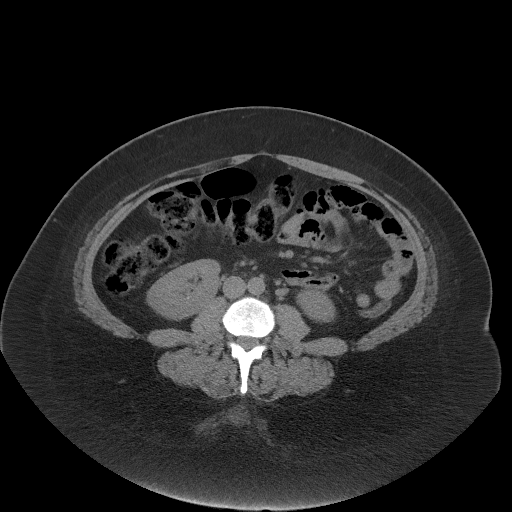
[im 54/88  bone]
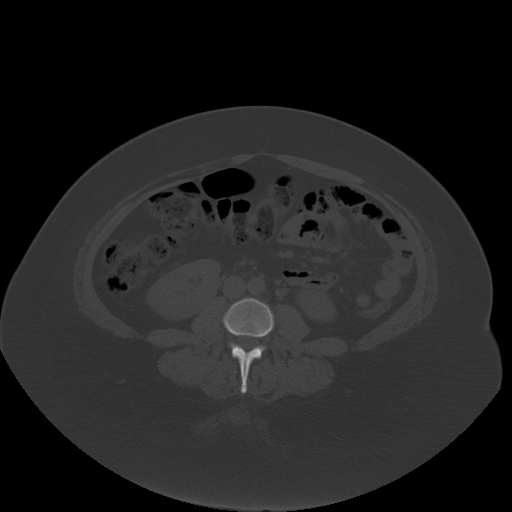
[im 57/88  soft-tissue]
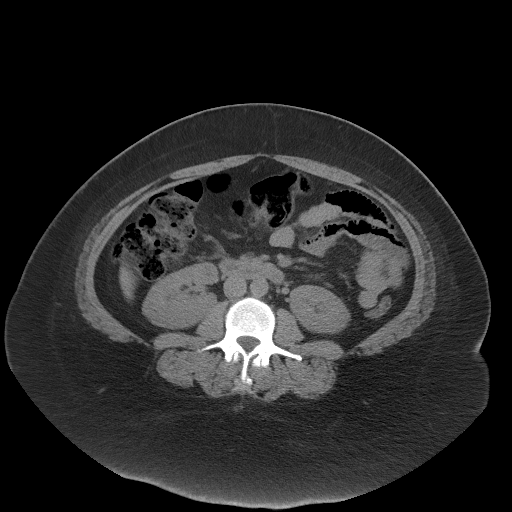
[im 64/88  soft-tissue]
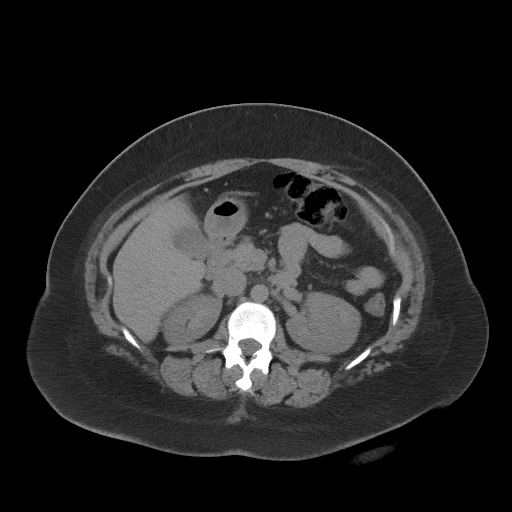
[im 71/88  soft-tissue]
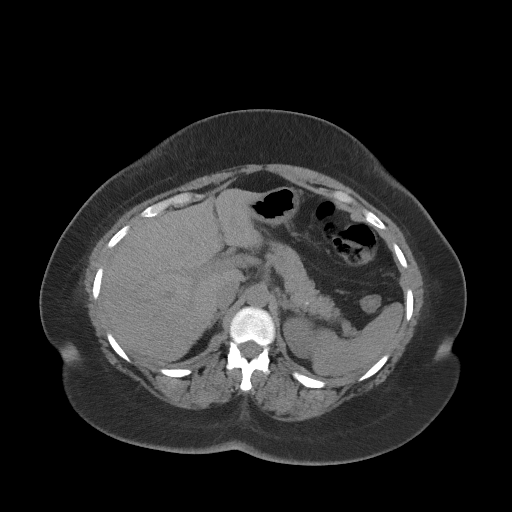
[im 77/88  soft-tissue]
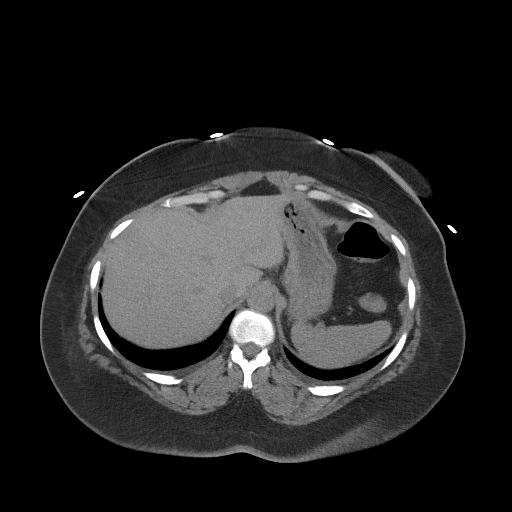
[im 84/88  soft-tissue]
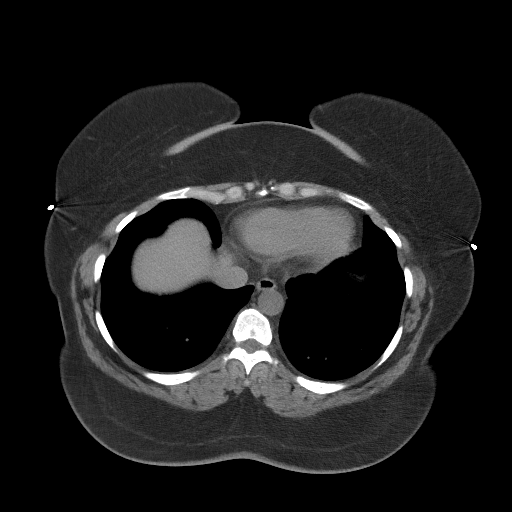

[Series 5: coronal st · coronal · 0.91mm/px · 3 of 98 slices shown]
[im 33/98  soft-tissue]
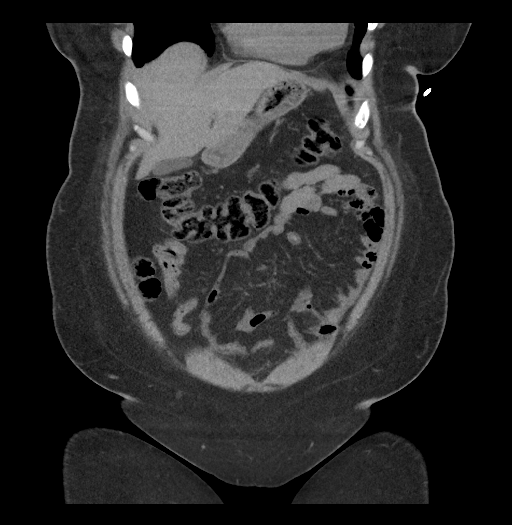
[im 44/98  soft-tissue]
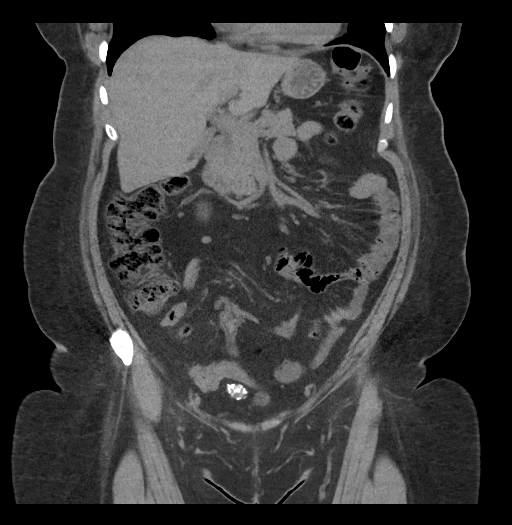
[im 54/98  soft-tissue]
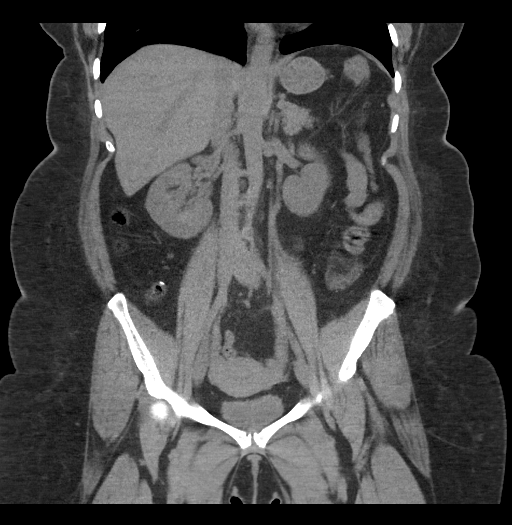

[17 of 46 positions shown; findings below may reference images not displayed]

FINDINGS: Lower chest: Normal heart size. Lung bases are clear. No pleural
effusion.

Hepatobiliary: Liver is normal in size and contour. Gallbladder is
unremarkable. No intrahepatic or extrahepatic biliary ductal
dilatation.

Pancreas: Few scattered calcifications demonstrated raising the
possibility of chronic calcific pancreatitis.

Spleen: Unremarkable

Adrenals/Urinary Tract: Normal adrenal glands. Kidneys are symmetric
in size. 3 mm stone inferior pole right kidney (image 36; series 2).
No ureterolithiasis. No hydronephrosis. Urinary bladder is
unremarkable.

Stomach/Bowel: Normal morphology of the stomach. No evidence for
small bowel obstruction. No free fluid or free intraperitoneal air.

Vascular/Lymphatic: Normal caliber abdominal aorta. No
retroperitoneal lymphadenopathy.

Reproductive: Exophytic calcified mass off the anterior uterine
fundus most compatible with fibroid. Adnexal structures
unremarkable.

Other: None.

Musculoskeletal: Lumbar spine degenerative changes. No aggressive or
acute appearing osseous lesions.
IMPRESSION: 1. No acute process within the abdomen or pelvis.
2. Nonobstructing right nephrolithiasis.
